# Patient Record
Sex: Male | Born: 1935 | Race: White | Hispanic: No | Marital: Married | Smoking: Never smoker
Health system: Southern US, Community
[De-identification: ages and names within clinical notes are randomized; demographics above are authoritative.]

## PROBLEM LIST (undated history)

## (undated) DIAGNOSIS — E7800 Pure hypercholesterolemia, unspecified: Secondary | ICD-10-CM

## (undated) DIAGNOSIS — C9110 Chronic lymphocytic leukemia of B-cell type not having achieved remission: Secondary | ICD-10-CM

## (undated) DIAGNOSIS — E119 Type 2 diabetes mellitus without complications: Secondary | ICD-10-CM

## (undated) DIAGNOSIS — I5189 Other ill-defined heart diseases: Secondary | ICD-10-CM

## (undated) DIAGNOSIS — G459 Transient cerebral ischemic attack, unspecified: Secondary | ICD-10-CM

## (undated) DIAGNOSIS — R252 Cramp and spasm: Secondary | ICD-10-CM

## (undated) DIAGNOSIS — I10 Essential (primary) hypertension: Secondary | ICD-10-CM

## (undated) DIAGNOSIS — K469 Unspecified abdominal hernia without obstruction or gangrene: Secondary | ICD-10-CM

## (undated) HISTORY — PX: HEMORRHOID SURGERY: SHX153

## (undated) HISTORY — PX: HERNIA REPAIR: SHX51

---

## 1999-08-22 ENCOUNTER — Other Ambulatory Visit: Admission: RE | Admit: 1999-08-22 | Discharge: 1999-08-22 | Payer: Self-pay | Admitting: *Deleted

## 2004-10-27 ENCOUNTER — Encounter: Admission: RE | Admit: 2004-10-27 | Discharge: 2005-01-25 | Payer: Self-pay | Admitting: Internal Medicine

## 2008-06-03 ENCOUNTER — Inpatient Hospital Stay (HOSPITAL_COMMUNITY): Admission: EM | Admit: 2008-06-03 | Discharge: 2008-06-05 | Payer: Self-pay | Admitting: Emergency Medicine

## 2010-02-03 ENCOUNTER — Inpatient Hospital Stay (HOSPITAL_COMMUNITY): Admission: EM | Admit: 2010-02-03 | Discharge: 2010-02-06 | Payer: Self-pay | Admitting: Emergency Medicine

## 2010-06-19 ENCOUNTER — Inpatient Hospital Stay (HOSPITAL_COMMUNITY): Admission: EM | Admit: 2010-06-19 | Discharge: 2010-06-24 | Payer: Self-pay | Admitting: Emergency Medicine

## 2011-01-23 LAB — CBC
HCT: 40.3 % (ref 39.0–52.0)
HCT: 46.1 % (ref 39.0–52.0)
Hemoglobin: 13.6 g/dL (ref 13.0–17.0)
Hemoglobin: 14.6 g/dL (ref 13.0–17.0)
MCH: 30.1 pg (ref 26.0–34.0)
MCV: 89.3 fL (ref 78.0–100.0)
Platelets: 125 10*3/uL — ABNORMAL LOW (ref 150–400)
RDW: 14.3 % (ref 11.5–15.5)
WBC: 19.7 10*3/uL — ABNORMAL HIGH (ref 4.0–10.5)
WBC: 20.9 10*3/uL — ABNORMAL HIGH (ref 4.0–10.5)

## 2011-01-23 LAB — DIFFERENTIAL
Basophils Absolute: 0 10*3/uL (ref 0.0–0.1)
Basophils Absolute: 0 10*3/uL (ref 0.0–0.1)
Basophils Relative: 0 % (ref 0–1)
Basophils Relative: 0 % (ref 0–1)
Eosinophils Absolute: 0.1 10*3/uL (ref 0.0–0.7)
Eosinophils Absolute: 0.2 10*3/uL (ref 0.0–0.7)
Eosinophils Relative: 1 % (ref 0–5)
Lymphocytes Relative: 52 % — ABNORMAL HIGH (ref 12–46)
Lymphocytes Relative: 65 % — ABNORMAL HIGH (ref 12–46)
Lymphs Abs: 10.9 10*3/uL — ABNORMAL HIGH (ref 0.7–4.0)
Lymphs Abs: 12.4 10*3/uL — ABNORMAL HIGH (ref 0.7–4.0)
Lymphs Abs: 13.2 10*3/uL — ABNORMAL HIGH (ref 0.7–4.0)
Monocytes Absolute: 1 10*3/uL (ref 0.1–1.0)
Monocytes Relative: 4 % (ref 3–12)
Monocytes Relative: 5 % (ref 3–12)
Monocytes Relative: 5 % (ref 3–12)
Neutro Abs: 6.1 10*3/uL (ref 1.7–7.7)
Neutro Abs: 9 10*3/uL — ABNORMAL HIGH (ref 1.7–7.7)
Neutrophils Relative %: 30 % — ABNORMAL LOW (ref 43–77)

## 2011-01-23 LAB — BASIC METABOLIC PANEL
BUN: 11 mg/dL (ref 6–23)
BUN: 16 mg/dL (ref 6–23)
Calcium: 9.5 mg/dL (ref 8.4–10.5)
Chloride: 107 mEq/L (ref 96–112)
Creatinine, Ser: 1.19 mg/dL (ref 0.4–1.5)
Creatinine, Ser: 1.31 mg/dL (ref 0.4–1.5)
GFR calc Af Amer: >60 mL/min (ref >60)
GFR calc non Af Amer: 52 mL/min — ABNORMAL LOW (ref >60)
GFR calc non Af Amer: 58 mL/min — ABNORMAL LOW (ref >60)
Glucose, Bld: 131 mg/dL — ABNORMAL HIGH (ref 70–99)
Potassium: 4.2 mEq/L (ref 3.5–5.1)
Sodium: 141 mEq/L (ref 135–145)

## 2011-01-23 LAB — CROSSMATCH
ABO/RH(D): B NEG
Antibody Screen: NEGATIVE

## 2011-01-23 LAB — MRSA PCR SCREENING: MRSA by PCR: NEGATIVE

## 2011-01-23 LAB — ABO/RH: ABO/RH(D): B NEG

## 2011-02-01 LAB — COMPREHENSIVE METABOLIC PANEL
ALT: 14 U/L (ref 0–53)
AST: 19 U/L (ref 0–37)
Alkaline Phosphatase: 54 U/L (ref 39–117)
Chloride: 103 mEq/L (ref 96–112)
Creatinine, Ser: 1.31 mg/dL (ref 0.4–1.5)
GFR calc Af Amer: >60 mL/min (ref >60)
Potassium: 4.1 mEq/L (ref 3.5–5.1)
Sodium: 141 mEq/L (ref 135–145)

## 2011-02-01 LAB — GLUCOSE, CAPILLARY
Glucose-Capillary: 128 mg/dL — ABNORMAL HIGH (ref 70–99)
Glucose-Capillary: 133 mg/dL — ABNORMAL HIGH (ref 70–99)
Glucose-Capillary: 135 mg/dL — ABNORMAL HIGH (ref 70–99)
Glucose-Capillary: 141 mg/dL — ABNORMAL HIGH (ref 70–99)
Glucose-Capillary: 143 mg/dL — ABNORMAL HIGH (ref 70–99)

## 2011-02-01 LAB — BASIC METABOLIC PANEL
BUN: 11 mg/dL (ref 6–23)
BUN: 15 mg/dL (ref 6–23)
BUN: 15 mg/dL (ref 6–23)
CO2: 29 mEq/L (ref 19–32)
CO2: 30 mEq/L (ref 19–32)
Chloride: 103 mEq/L (ref 96–112)
Chloride: 103 mEq/L (ref 96–112)
Creatinine, Ser: 1.31 mg/dL (ref 0.4–1.5)
Creatinine, Ser: 1.32 mg/dL (ref 0.4–1.5)
Creatinine, Ser: 1.38 mg/dL (ref 0.4–1.5)
GFR calc Af Amer: >60 mL/min (ref >60)
GFR calc non Af Amer: 52 mL/min — ABNORMAL LOW (ref >60)
Glucose, Bld: 154 mg/dL — ABNORMAL HIGH (ref 70–99)
Potassium: 3.6 mEq/L (ref 3.5–5.1)

## 2011-02-01 LAB — CBC
HCT: 40.8 % (ref 39.0–52.0)
MCHC: 34.7 g/dL (ref 30.0–36.0)
MCHC: 34.9 g/dL (ref 30.0–36.0)
MCV: 87.9 fL (ref 78.0–100.0)
MCV: 88.2 fL (ref 78.0–100.0)
Platelets: 131 10*3/uL — ABNORMAL LOW (ref 150–400)
Platelets: 138 10*3/uL — ABNORMAL LOW (ref 150–400)
Platelets: 139 10*3/uL — ABNORMAL LOW (ref 150–400)
Platelets: 165 10*3/uL (ref 150–400)
RDW: 14.1 % (ref 11.5–15.5)
WBC: 15.2 10*3/uL — ABNORMAL HIGH (ref 4.0–10.5)
WBC: 18.8 10*3/uL — ABNORMAL HIGH (ref 4.0–10.5)

## 2011-02-01 LAB — DIFFERENTIAL
Basophils Absolute: 0.1 10*3/uL (ref 0.0–0.1)
Basophils Absolute: 0.1 10*3/uL (ref 0.0–0.1)
Basophils Relative: 0 % (ref 0–1)
Basophils Relative: 1 % (ref 0–1)
Eosinophils Absolute: 0.1 10*3/uL (ref 0.0–0.7)
Eosinophils Absolute: 0.1 10*3/uL (ref 0.0–0.7)
Eosinophils Relative: 0 % (ref 0–5)
Eosinophils Relative: 1 % (ref 0–5)
Lymphocytes Relative: 62 % — ABNORMAL HIGH (ref 12–46)
Lymphs Abs: 11.7 10*3/uL — ABNORMAL HIGH (ref 0.7–4.0)
Monocytes Relative: 4 % (ref 3–12)
Monocytes Relative: 4 % (ref 3–12)
Neutro Abs: 6.9 10*3/uL (ref 1.7–7.7)
Neutrophils Relative %: 30 % — ABNORMAL LOW (ref 43–77)
Neutrophils Relative %: 31 % — ABNORMAL LOW (ref 43–77)
Neutrophils Relative %: 33 % — ABNORMAL LOW (ref 43–77)
Neutrophils Relative %: 35 % — ABNORMAL LOW (ref 43–77)

## 2011-02-01 LAB — LIPASE, BLOOD: Lipase: 36 U/L (ref 11–59)

## 2011-02-01 LAB — URINALYSIS, ROUTINE W REFLEX MICROSCOPIC
Bilirubin Urine: NEGATIVE
Hgb urine dipstick: NEGATIVE
Ketones, ur: NEGATIVE mg/dL
Nitrite: NEGATIVE
Urobilinogen, UA: 0.2 mg/dL (ref 0.0–1.0)
pH: 6.5 (ref 5.0–8.0)

## 2011-03-24 NOTE — Discharge Summary (Signed)
NAME:  Jeremy Mcdaniel, Jeremy Mcdaniel                ACCOUNT NO.:  [id number]   MEDICAL RECORD NO.:  [id number]          PATIENT TYPE:  INP   LOCATION:  A327                          FACILITY:  [facility]   PHYSICIAN:  Dorris Singh, DO    DATE OF BIRTH:  09/01/1923   DATE OF ADMISSION:  06/03/2008  DATE OF DISCHARGE:  07/28/2009LH                               DISCHARGE SUMMARY   ADMISSION DIAGNOSES:  1. Vertigo.  2. Leukocytosis.  3. History of diabetes.  4. History of hypertension.  5. History of hypercholesterolemia.   DISCHARGE DIAGNOSES:  1. Vertigo, resolving.  2. Leukocytosis, resolved.  3. History of diabetes.  4. History of hypertension.  5. History of hypercholesterolemia.   Testing that was done includes on July 27, a chest x-ray which showed  cardiomegaly and central pulmonary vessel nominate, question a tortuous  aorta, dilated esophagus versus hiatal hernia.  MRI of the brain on the  27th demonstrates chronic white matter changes at present most likely  due to chronic ischemia.  No abnormality.  On July 27, also carotid  Doppler was done which showed no significant carotid plaquing or  stenosis.  His H&P was done by Dr. Lilian Kapur.  To summarize, the patient  is an 77 year old Caucasian male who presented to the [facility]  Emergency Room stating after he was at church and while he was at  church, he found that he was unable to start walking and became very  dizzy and lightheaded and still had the sensation of the church  spinning.  He was then brought and evaluated and diagnosed with vertigo.  He was started on meclizine as well as IV fluids.  He did have a  leukocytosis of an unknown site.  It was thought to be sinus, so he was  placed on Levaquin and which he continued to improve.  Today, the  patient was seen.   Vital signs are within normal limits.  He was seen in the room with his  wife.  He states he wants to go home.  Heart rate is regular rate and rhythm.  LUNGS:  Clear to  auscultation bilaterally.  ABDOMEN:  Soft, nontender, nondistended.  The patient states that the vertigo has decreased.   His white count today is 11.7, hemoglobin 15.2, hematocrit 45.2, and  platelet count 144.  Sodium was 143, potassium 3.7, chloride 107, CO2  29, glucose 120, BUN 19, creatinine 1.30.   The patient will be sent home on the following medications:  1. Atenolol 50/25, 1 p.o. daily.  2. Aspirin 81 mg 1 p.o. daily.  3. Simvastatin 20 mg 1 p.o. daily.  4. He will also be on Antivert 25 mg p.o. q.6h. p.r.n.  5. Levaquin 500 mg p.o. daily x5 days.   He is to follow up with his primary care physician in the next 3-5 days.  He is to go home on a diabetic diet.  He is to increase his activity  slowly and to walk with assistance.  He is also to do vertigo rehab  exercises which I showed him in looking at objects  3-5 feet away,  fixating on an object, and shaking your head yes and no, doing this 20  times every hour on the hour for the next several days until he  improves.  Also, it is noted that the patient has diabetes; however,  there is no mention of any diabetic medications that he is on.  I will  clarify this with the patient prior to discharge today and have him  follow up with his primary care physician which we do not have listed,  but I assume we will get that information as well.      Dorris Singh, DO  Electronically Signed     CB/MEDQ  D:  06/05/2008  T:  06/05/2008  Job:  [id number]

## 2011-03-24 NOTE — H&P (Signed)
NAME:  Jeremy Mcdaniel, Jeremy Mcdaniel                ACCOUNT NO.:  [id number]   MEDICAL RECORD NO.:  [id number]          PATIENT TYPE:  INP   LOCATION:  A327                          FACILITY:  [facility]   PHYSICIAN:  Skeet Latch, DO    DATE OF BIRTH:  12-20-1922   DATE OF ADMISSION:  06/03/2008  DATE OF DISCHARGE:  LH                              HISTORY & PHYSICAL   CHIEF COMPLAINT:  Dizziness.   HISTORY OF PRESENT ILLNESS:  This is an 77 year old Caucasian male who  presents with dizziness.  The patient states after going to church he  was singing in the choir and after walking back to his seat he began to  be lightheaded, dizzy, and felt that the room was spinning.  The  patient's says after being seated, he still had the sensation that the  room was spinning.  The patient was then brought to the emergency room  to be evaluated.  The patient has no history of any prior episodes of  lightheadedness or dizziness.  On my examination, the patient is lying  in the bed stating the symptoms are about 75% relieved at this time.  The patient denies any chest pain, shortness of breath, or any abdominal  discomfort or headache.   PAST MEDICAL HISTORY:  1. Diabetes.  2. Hypercholesterolemia.  3. Hypertension.  4. Leukemia.   PAST SURGICAL HISTORY:  1. Hemorrhoidectomy.  2. Hernia repair.   SOCIAL HISTORY:  No history of alcohol, smoking, or drug abuse.  He does  live with his wife.   ALLERGIES:  CODEINE, SULFA, PENICILLIN, AZULFIDINE.   HOME MEDICATIONS:  1. Atenolol unknown dose once a day.  2. Aspirin 81 mg daily.  3. Cholesterol medication.   REVIEW OF SYSTEMS:  HEENT:  Slightly blurred vision.  CARDIOVASCULAR:  No chest pain or palpitations.  He did have some diaphoresis.  RESPIRATORY:  No shortness of breath or dyspnea.  GASTROINTESTINAL:  No  nausea and vomiting, diarrhea, or abdominal pain.  GENITOURINARY:  No  urgency or frequency.  MUSCULOSKELETAL:  Unremarkable.  SKIN:  Unremarkable.   NEUROLOGY:  Positive for some mild vertigo.  Other  systems unremarkable.   PHYSICAL EXAMINATION:  VITAL SIGNS:  Temperature 97.1, respirations 20,  pulse 58, blood pressure 170/74.  GENERAL:  He is well-developed, well-nourished, and well-hydrated in no  acute distress.  HEENT:  Head is normocephalic and atraumatic.  PERRLA.  EOMI.  NECK:  Soft, supple, nontender, and nondistended.  CARDIOVASCULAR:  Regular rate and rhythm.  Slight ejection murmur.  No  rubs or gallops.  LUNGS:  Clear to auscultation bilaterally.  No rales, rhonchi, or  wheezing.  ABDOMEN:  Obese, soft, nontender, and nondistended.  He does have some  abdominal fullness in his left lower quadrant.  No rigidity or guarding.  EXTREMITIES:  He has trace edema.  No clubbing or cyanosis.  NEUROLOGY:  Cranial nerves II-XII grossly intact.  The patient is alert  and oriented x3.  Good coordination and normal speech.  He has some  vertigo with change in position.   EKG showed normal sinus rhythm,  normal axis.  Normal QRS.  No ST or T  wave changes.   LABORATORY DATA:  White count 13.7, hemoglobin 16.2, hematocrit 47.6,  platelet count 153.  Sodium 140, potassium 3.7, chloride 103, CO2 29,  glucose 133, BUN 19, creatinine 1.29, calcium 9.4.  AST 20, ALT 19,  alkaline phosphatase 62, total bilirubin 0.5.  Troponin was less than  0.05.   ASSESSMENT:  1. Vertigo.  2. Leukocytosis.  3. History of diabetes.  4. History of hypertension.  5. History of hypercholesterolemia.   PLAN:  The patient will be admitted to the service of [facility].  For  his vertigo, we will rule out any acute bleed or masses.  We will get an  MRI in the a.m.  The patient will be placed on Meclizine every 4-6 hours  as needed for any vertigo type symptoms.  The patient may benefit from  some vestibular exercises also.  We will also get some orthostatics upon  admission.  For leukocytosis we will obtain blood and urine cultures.  Empirically place  the patient on Cipro orally at this time.  For his  diabetes, the patient will be placed on a sliding scale with blood  sugars checked a.c. and h.s.  For his hypertension, the patient will be  placed on atenolol, I do not know his home daily dose.  We will put him  on a low dose of atenolol at this time and await his home dose.  The  patient will be on DVT as well as GI prophylaxis.      Skeet Latch, DO  Electronically Signed     SM/MEDQ  D:  06/03/2008  T:  06/03/2008  Job:  [id number]

## 2011-03-24 NOTE — Group Therapy Note (Signed)
NAME:  BARETT, WHIDBEE                ACCOUNT NO.:  [id number]   MEDICAL RECORD NO.:  [id number]          PATIENT TYPE:  INP   LOCATION:  A327                          FACILITY:  [facility]   PHYSICIAN:  Skeet Latch, DO    DATE OF BIRTH:  [birth year]   DATE OF PROCEDURE:  06/04/2008  DATE OF DISCHARGE:                                 PROGRESS NOTE   SUBJECTIVE:  Mr. Housand still states that he has some mild vertigo-type  symptoms.  The patient states they seem to be slowly improving, but they  are still present at this time.  The patient denies any chest pain,  headache, nausea, vomiting or any abdominal complaints at this time.   OBJECTIVE:  VITAL SIGNS:  Temperature 97.8, pulse 57, respirations 20,  blood pressure 164/84.  CARDIOVASCULAR:  Regular rate and rhythm.  He has a systolic ejection  murmur heard at the left sternal border.  No rubs or gallops.  LUNGS:  Clear to auscultation bilaterally.  No rhonchi, rales or  wheezes.  ABDOMEN:  Soft, nontender, nondistended.  Positive bowel sounds.  No  rigidity or guarding.  EXTREMITIES:  Trace edema.  No clubbing or cyanosis.  Good pulses.   LABORATORY DATA:  White count 16.7, hemoglobin 15.3, hematocrit 44.4,  platelets 159.  Blood cultures are pending.   ASSESSMENT/PLAN:  1. Vertigo.  The patient's vertigo still present at this time.  Will      continue him on meclizine every 6 hours.  The patient is go get an      MRI of his brain this a.m. as well as carotid Doppler studies.      Will give further recommendations after the results of these      studies.  2. Leukocytosis.  Seems to be slightly worsened this a.m.  The patient      was started on Cipro orally.  Will change him to IV antibiotics at      this time and awaiting urine and embolic blood cultures.  The      patient does have a pending chest x-ray this a.m.      Skeet Latch, DO  Electronically Signed     SM/MEDQ  D:  06/04/2008  T:  06/04/2008  Job:  [id number]

## 2011-08-07 LAB — COMPREHENSIVE METABOLIC PANEL
AST: 20
Albumin: 4
BUN: 19
Calcium: 9.4
Creatinine, Ser: 1.29
GFR calc Af Amer: >60
GFR calc non Af Amer: 53 — ABNORMAL LOW

## 2011-08-07 LAB — BASIC METABOLIC PANEL
CO2: 27
CO2: 29
Calcium: 9.3
Chloride: 104
Chloride: 107
Creatinine, Ser: 1.3
GFR calc Af Amer: >60
GFR calc Af Amer: >60
Glucose, Bld: 117 — ABNORMAL HIGH
Glucose, Bld: 120 — ABNORMAL HIGH
Potassium: 3.6
Sodium: 140
Sodium: 143

## 2011-08-07 LAB — DIFFERENTIAL
Basophils Absolute: 0
Basophils Absolute: 0.1
Basophils Absolute: 0.1
Basophils Relative: 0
Basophils Relative: 0
Eosinophils Absolute: 0.1
Eosinophils Relative: 1
Eosinophils Relative: 1
Eosinophils Relative: 1
Lymphocytes Relative: 65 — ABNORMAL HIGH
Lymphs Abs: 9 — ABNORMAL HIGH
Monocytes Absolute: 0.6
Monocytes Absolute: 0.8
Monocytes Relative: 5
Monocytes Relative: 5
Monocytes Relative: 6
Neutro Abs: 3.7
Neutro Abs: 3.8

## 2011-08-07 LAB — CBC
HCT: 44.4
HCT: 47.6
Hemoglobin: 15.2
Hemoglobin: 15.3
MCHC: 33.7
MCHC: 34.1
MCHC: 34.5
MCV: 89.9
MCV: 90
Platelets: 153
RBC: 5
RBC: 5.02
RDW: 13.5
RDW: 13.9

## 2011-08-07 LAB — POCT CARDIAC MARKERS
CKMB, poc: 2.2
Operator id: 208461
Troponin i, poc: <0.05

## 2011-08-07 LAB — URINALYSIS, ROUTINE W REFLEX MICROSCOPIC
Bilirubin Urine: NEGATIVE
Ketones, ur: NEGATIVE
Nitrite: NEGATIVE
Protein, ur: NEGATIVE
Urobilinogen, UA: 0.2
pH: 7

## 2011-08-07 LAB — CULTURE, BLOOD (ROUTINE X 2)
Culture: NO GROWTH
Report Status: 8022009

## 2011-08-07 LAB — URINE CULTURE: Colony Count: NO GROWTH

## 2011-09-27 ENCOUNTER — Emergency Department (HOSPITAL_COMMUNITY): Payer: Medicare Other

## 2011-09-27 ENCOUNTER — Emergency Department (HOSPITAL_COMMUNITY)
Admission: EM | Admit: 2011-09-27 | Discharge: 2011-09-27 | Disposition: A | Discharge status: Home / Self Care | Payer: Medicare Other | Attending: Emergency Medicine | Admitting: Emergency Medicine

## 2011-09-27 DIAGNOSIS — M79609 Pain in unspecified limb: Secondary | ICD-10-CM | POA: Insufficient documentation

## 2011-09-27 DIAGNOSIS — M25559 Pain in unspecified hip: Secondary | ICD-10-CM | POA: Insufficient documentation

## 2011-09-27 DIAGNOSIS — X500XXA Overexertion from strenuous movement or load, initial encounter: Secondary | ICD-10-CM | POA: Insufficient documentation

## 2011-09-27 DIAGNOSIS — I10 Essential (primary) hypertension: Secondary | ICD-10-CM | POA: Insufficient documentation

## 2011-09-27 DIAGNOSIS — S76019A Strain of muscle, fascia and tendon of unspecified hip, initial encounter: Secondary | ICD-10-CM

## 2011-09-27 DIAGNOSIS — Z7982 Long term (current) use of aspirin: Secondary | ICD-10-CM | POA: Insufficient documentation

## 2011-09-27 DIAGNOSIS — E7800 Pure hypercholesterolemia, unspecified: Secondary | ICD-10-CM | POA: Insufficient documentation

## 2011-09-27 DIAGNOSIS — IMO0002 Reserved for concepts with insufficient information to code with codable children: Secondary | ICD-10-CM | POA: Insufficient documentation

## 2011-09-27 HISTORY — DX: Essential (primary) hypertension: I10

## 2011-09-27 HISTORY — DX: Pure hypercholesterolemia, unspecified: E78.00

## 2011-09-27 HISTORY — DX: Unspecified abdominal hernia without obstruction or gangrene: K46.9

## 2011-09-27 MED ORDER — IBUPROFEN 800 MG PO TABS
800.0000 mg | ORAL_TABLET | Freq: Once | ORAL | Status: AC
Start: 2011-09-27 — End: 2011-09-27
  Administered 2011-09-27: 800 mg via ORAL
  Filled 2011-09-27: qty 1

## 2011-09-27 MED ORDER — TRAMADOL HCL 50 MG PO TABS: 50.0000 mg | ORAL_TABLET | Freq: Four times a day (QID) | ORAL | Status: DC | PRN

## 2011-09-27 MED ORDER — TRAMADOL HCL 50 MG PO TABS: 50.0000 mg | ORAL_TABLET | Freq: Four times a day (QID) | ORAL | Status: AC | PRN

## 2011-09-27 NOTE — ED Notes (Signed)
MD at bedside. Dr Manus Gunning at bedside, discussing plan of care with pt and family.

## 2011-09-27 NOTE — ED Provider Notes (Signed)
History     CSN: [id number] Arrival date & time: 09/27/2011 12:46 PM   First MD Initiated Contact with Patient 09/27/11 1300      Chief Complaint  Patient presents with  . Hip Pain  . Leg Pain    (Consider location/radiation/quality/duration/timing/severity/associated sxs/prior treatment) HPI Comments: 77 year old male presenting with right buttock and leg pain started yesterday. He states he was climbing a ladder yesterday putting trees and the pain started shortly after that. He denies any falls. He is in pain with walking but still able to ambulate. The pain is in his right buttock and radiates down the side and back of his right side. He stopped at about his knee.  He's had no previous back related problems. He denies any fever, vomiting, bowel or bladder incontinence, , weakness, numbness or tingling. He denies pain in a pins and needle sensation. Denies abdominal pain, chest pain, groin pain, back pain.  Patient is a 77 y.o. male presenting with hip pain. The history is provided by the patient and a relative.  Hip Pain This is a new problem. The current episode started yesterday. The problem occurs constantly. The problem has been gradually improving. Pertinent negatives include no chest pain, no abdominal pain, no headaches and no shortness of breath. The symptoms are aggravated by walking, bending and twisting. The symptoms are relieved by acetaminophen, heat and NSAIDs. He has tried a warm compress for the symptoms. The treatment provided mild relief.    Past Medical History  Diagnosis Date  . Hypertension   . Hypercholesterolemia   . Hernia     Past Surgical History  Procedure Date  . Hernia repair     No family history on file.  History  Substance Use Topics  . Smoking status: Never Smoker   . Smokeless tobacco: Not on file  . Alcohol Use: No      Review of Systems  Constitutional: Positive for activity change.  HENT: Negative for congestion and rhinorrhea.    Respiratory: Negative for cough and shortness of breath.   Cardiovascular: Negative for chest pain.  Gastrointestinal: Negative for nausea, vomiting and abdominal pain.  Genitourinary: Negative for dysuria, difficulty urinating and testicular pain.  Musculoskeletal: Negative for back pain.  Neurological: Negative for weakness, numbness and headaches.    Allergies  Azulfidine; Codeine; Penicillins; and Sulfa antibiotics  Home Medications   Current Outpatient Rx  Name Route Sig Dispense Refill  . ASPIRIN EC 81 MG PO TBEC Oral Take 81 mg by mouth daily.     . ATENOLOL PO Oral Take 1 tablet by mouth daily. Unknown strength. Does not take when BP is "too low"    . LISINOPRIL PO Oral Take 0.5 tablets by mouth daily. Unknown strength     . SIMVASTATIN PO Oral Take 0.5 tablets by mouth daily. Unknown strength    . TRAMADOL HCL 50 MG PO TABS Oral Take 1 tablet (50 mg total) by mouth every 6 (six) hours as needed for pain. Maximum dose= 8 tablets per day 15 tablet 0    BP 152/69  Pulse 55  Temp(Src) 98.7 F (37.1 C) (Oral)  Resp 20  Ht 5\' 7"  (1.702 m)  Wt 198 lb (89.812 kg)  BMI 31.01 kg/m2  SpO2 100%  Physical Exam  Constitutional: He is oriented to person, place, and time. He appears well-developed and well-nourished. No distress.  HENT:  Head: Normocephalic and atraumatic.  Mouth/Throat: Oropharynx is clear and moist. No oropharyngeal exudate.  Eyes: Conjunctivae are  normal. Pupils are equal, round, and reactive to light.  Neck: Normal range of motion.  Cardiovascular: Normal rate, regular rhythm and normal heart sounds.   Pulmonary/Chest: Effort normal and breath sounds normal. No respiratory distress.  Abdominal: Soft. There is no tenderness. There is no rebound and no guarding.       No pulsatile masses  Musculoskeletal: Normal range of motion. He exhibits no edema and no tenderness.       No lumbar spine tenderness, no step off or deformity. Full range of motion of right  hip and knee without pain. Straight leg raise test negative bilaterally  Neurological: He is alert and oriented to person, place, and time. No cranial nerve deficit.       5/5 strength in bilateral lower extremities. Able to dorsiflex and plantarflex ankles and extend great toes. +2  DP PT pulses bilaterally there is no calf tenderness or palpable cords. Unable to illicit patellar reflexes bilaterally  Skin: Skin is warm.       No rashes    ED Course  Procedures (including critical care time)  Labs Reviewed - No data to display Dg Lumbar Spine Complete  09/27/2011  *RADIOLOGY REPORT*  Clinical Data: Right hip/leg pain  LUMBAR SPINE - COMPLETE 4+ VIEW  Comparison: CT abdomen pelvis dated 06/18/2010  Findings: There are five lumbar-type vertebral bodies.  No evidence of fracture or dislocation.  Vertebral body heights are maintained.  Moderate multilevel degenerative changes.  Prior left hernia repair.  IMPRESSION: No fracture or dislocation is seen.  Moderate multilevel degenerative changes.  Original Report Authenticated By: Charline Bills, M.D.   Dg Pelvis 1-2 Views  09/27/2011  *RADIOLOGY REPORT*  Clinical Data: Right hip pain  PELVIS - 1-2 VIEW  Comparison: CT abdomen pelvis dated 06/18/2010  Findings: No fracture or dislocation is seen.  Bilateral hip joint spaces are symmetric.  Left ventral hernia repair.  IMPRESSION: No fracture or dislocation is seen.  Original Report Authenticated By: Charline Bills, M.D.     1. Hip strain       MDM  R buttock and thigh pain after exertion and in sciatic distribution.  No neuro or pulse deficit.  No red flags. No back, chest, or abdominal pain.  Source of pain appears to be musculoskeletal, but other diagnoses considered (AAA, CNS pathology, GB) and unlikely.  Able to ambulate with normal gait.   Given age, will obtain plain film imaging.   imaging is negative for any acute pathology. Patient states he is in no pain at this time. He is  ambulatory in the hallways without assistance.       Glynn Octave, MD 09/27/11 2252

## 2011-09-27 NOTE — ED Notes (Signed)
Pt presents with right hip and leg pain. Pt was climbing a ladder yesterday and after finished with working pt started having pain. Pt unable to sleep.

## 2012-05-04 NOTE — Patient Instructions (Addendum)
Your procedure is scheduled on: 05/16/2012  Report to [facility] at  900       AM.  Call this number if you have problems the morning of surgery: [phone]   Do not eat food or drink liquids :After Midnight.      Take these medicines the morning of surgery with A SIP OF WATER: atenolol, lisinopril   Do not wear jewelry, make-up or nail polish.  Do not wear lotions, powders, or perfumes. You may wear deodorant.  Do not shave 48 hours prior to surgery.  Do not bring valuables to the hospital.  Contacts, dentures or bridgework may not be worn into surgery.  Leave suitcase in the car. After surgery it may be brought to your room.  For patients admitted to the hospital, checkout time is 11:00 AM the day of discharge.   Patients discharged the day of surgery will not be allowed to drive home.  :     Please read over the following fact sheets that you were given: Coughing and Deep Breathing, Surgical Site Infection Prevention, Anesthesia Post-op Instructions and Care and Recovery After Surgery    Cataract A cataract is a clouding of the lens of the eye. When a lens becomes cloudy, vision is reduced based on the degree and nature of the clouding. Many cataracts reduce vision to some degree. Some cataracts make people more near-sighted as they develop. Other cataracts increase glare. Cataracts that are ignored and become worse can sometimes look white. The white color can be seen through the pupil. CAUSES   Aging. However, cataracts may occur at any age, even in newborns.   Certain drugs.   Trauma to the eye.   Certain diseases such as diabetes.   Specific eye diseases such as chronic inflammation inside the eye or a sudden attack of a rare form of glaucoma.   Inherited or acquired medical problems.  SYMPTOMS   Gradual, progressive drop in vision in the affected eye.   Severe, rapid visual loss. This most often happens when trauma is the cause.  DIAGNOSIS  To detect a cataract, an  eye doctor examines the lens. Cataracts are best diagnosed with an exam of the eyes with the pupils enlarged (dilated) by drops.  TREATMENT  For an early cataract, vision may improve by using different eyeglasses or stronger lighting. If that does not help your vision, surgery is the only effective treatment. A cataract needs to be surgically removed when vision loss interferes with your everyday activities, such as driving, reading, or watching TV. A cataract may also have to be removed if it prevents examination or treatment of another eye problem. Surgery removes the cloudy lens and usually replaces it with a substitute lens (intraocular lens, IOL).  At a time when both you and your doctor agree, the cataract will be surgically removed. If you have cataracts in both eyes, only one is usually removed at a time. This allows the operated eye to heal and be out of danger from any possible problems after surgery (such as infection or poor wound healing). In rare cases, a cataract may be doing damage to your eye. In these cases, your caregiver may advise surgical removal right away. The vast majority of people who have cataract surgery have better vision afterward. HOME CARE INSTRUCTIONS  If you are not planning surgery, you may be asked to do the following:  Use different eyeglasses.   Use stronger or brighter lighting.   Ask your eye doctor  about reducing your medicine dose or changing medicines if it is thought that a medicine caused your cataract. Changing medicines does not make the cataract go away on its own.   Become familiar with your surroundings. Poor vision can lead to injury. Avoid bumping into things on the affected side. You are at a higher risk for tripping or falling.   Exercise extreme care when driving or operating machinery.   Wear sunglasses if you are sensitive to bright light or experiencing problems with glare.  SEEK IMMEDIATE MEDICAL CARE IF:   You have a worsening or sudden  vision loss.   You notice redness, swelling, or increasing pain in the eye.   You have a fever.  Document Released: 10/26/2005 Document Revised: 10/15/2011 Document Reviewed: 06/19/2011 [facility] Patient Information 2012 [place], [place].PATIENT INSTRUCTIONS POST-ANESTHESIA  IMMEDIATELY FOLLOWING SURGERY:  Do not drive or operate machinery for the first twenty four hours after surgery.  Do not make any important decisions for twenty four hours after surgery or while taking narcotic pain medications or sedatives.  If you develop intractable nausea and vomiting or a severe headache please notify your doctor immediately.  FOLLOW-UP:  Please make an appointment with your surgeon as instructed. You do not need to follow up with anesthesia unless specifically instructed to do so.  WOUND CARE INSTRUCTIONS (if applicable):  Keep a dry clean dressing on the anesthesia/puncture wound site if there is drainage.  Once the wound has quit draining you may leave it open to air.  Generally you should leave the bandage intact for twenty four hours unless there is drainage.  If the epidural site drains for more than 36-48 hours please call the anesthesia department.  QUESTIONS?:  Please feel free to call your physician or the hospital operator if you have any questions, and they will be happy to assist you.

## 2012-05-05 ENCOUNTER — Other Ambulatory Visit: Payer: Self-pay

## 2012-05-05 ENCOUNTER — Encounter (HOSPITAL_COMMUNITY)
Admission: RE | Admit: 2012-05-05 | Discharge: 2012-05-05 | Disposition: A | Discharge status: Home / Self Care | Payer: Medicare Other | Source: Ambulatory Visit | Attending: Ophthalmology | Admitting: Ophthalmology

## 2012-05-05 ENCOUNTER — Encounter (HOSPITAL_COMMUNITY): Payer: Self-pay

## 2012-05-05 HISTORY — DX: Cramp and spasm: R25.2

## 2012-05-05 LAB — BASIC METABOLIC PANEL
BUN: 29 mg/dL — ABNORMAL HIGH (ref 6–23)
Calcium: 9.8 mg/dL (ref 8.4–10.5)
Creatinine, Ser: 1.64 mg/dL — ABNORMAL HIGH (ref 0.50–1.35)
GFR calc Af Amer: 42 mL/min — ABNORMAL LOW (ref >90)
GFR calc non Af Amer: 36 mL/min — ABNORMAL LOW (ref >90)
Glucose, Bld: 118 mg/dL — ABNORMAL HIGH (ref 70–99)

## 2012-05-13 MED ORDER — NEOMYCIN-POLYMYXIN-DEXAMETH 3.5-10000-0.1 OP OINT
TOPICAL_OINTMENT | OPHTHALMIC | Status: AC
  Filled 2012-05-13: qty 3.5

## 2012-05-13 MED ORDER — LIDOCAINE HCL (PF) 1 % IJ SOLN
INTRAMUSCULAR | Status: AC
  Filled 2012-05-13: qty 2

## 2012-05-13 MED ORDER — PHENYLEPHRINE HCL 2.5 % OP SOLN
OPHTHALMIC | Status: AC
  Filled 2012-05-13: qty 2

## 2012-05-13 MED ORDER — TETRACAINE HCL 0.5 % OP SOLN
OPHTHALMIC | Status: AC
  Filled 2012-05-13: qty 2

## 2012-05-13 MED ORDER — LIDOCAINE HCL 3.5 % OP GEL
OPHTHALMIC | Status: AC
  Filled 2012-05-13: qty 5

## 2012-05-13 MED ORDER — CYCLOPENTOLATE-PHENYLEPHRINE 0.2-1 % OP SOLN
OPHTHALMIC | Status: AC
  Filled 2012-05-13: qty 2

## 2012-05-16 ENCOUNTER — Encounter (HOSPITAL_COMMUNITY): Payer: Self-pay | Admitting: Anesthesiology

## 2012-05-16 ENCOUNTER — Encounter (HOSPITAL_COMMUNITY)
Admission: RE | Disposition: A | Discharge status: Home Health | Payer: Self-pay | Source: Ambulatory Visit | Attending: Ophthalmology

## 2012-05-16 ENCOUNTER — Ambulatory Visit (HOSPITAL_COMMUNITY)
Admission: RE | Admit: 2012-05-16 | Discharge: 2012-05-16 | Disposition: A | Discharge status: Home Health | Payer: Medicare Other | Source: Ambulatory Visit | Attending: Ophthalmology | Admitting: Ophthalmology

## 2012-05-16 ENCOUNTER — Ambulatory Visit (HOSPITAL_COMMUNITY): Payer: Medicare Other | Admitting: Anesthesiology

## 2012-05-16 ENCOUNTER — Encounter (HOSPITAL_COMMUNITY): Payer: Self-pay | Admitting: Ophthalmology

## 2012-05-16 DIAGNOSIS — Z01812 Encounter for preprocedural laboratory examination: Secondary | ICD-10-CM | POA: Insufficient documentation

## 2012-05-16 DIAGNOSIS — Z79899 Other long term (current) drug therapy: Secondary | ICD-10-CM | POA: Insufficient documentation

## 2012-05-16 DIAGNOSIS — E119 Type 2 diabetes mellitus without complications: Secondary | ICD-10-CM | POA: Insufficient documentation

## 2012-05-16 DIAGNOSIS — H2510 Age-related nuclear cataract, unspecified eye: Secondary | ICD-10-CM | POA: Insufficient documentation

## 2012-05-16 DIAGNOSIS — Z01810 Encounter for preprocedural cardiovascular examination: Secondary | ICD-10-CM | POA: Insufficient documentation

## 2012-05-16 DIAGNOSIS — I10 Essential (primary) hypertension: Secondary | ICD-10-CM | POA: Insufficient documentation

## 2012-05-16 HISTORY — PX: CATARACT EXTRACTION W/PHACO: SHX586

## 2012-05-16 SURGERY — PHACOEMULSIFICATION, CATARACT, WITH IOL INSERTION
Anesthesia: Monitor Anesthesia Care | Site: Eye | Laterality: Left | Wound class: Clean

## 2012-05-16 MED ORDER — POVIDONE-IODINE 5 % OP SOLN
OPHTHALMIC | Status: DC | PRN
  Administered 2012-05-16: 1 via OPHTHALMIC

## 2012-05-16 MED ORDER — PROVISC 10 MG/ML IO SOLN
INTRAOCULAR | Status: DC | PRN
  Administered 2012-05-16: 8.5 mg via INTRAOCULAR

## 2012-05-16 MED ORDER — NEOMYCIN-POLYMYXIN-DEXAMETH 0.1 % OP OINT
TOPICAL_OINTMENT | OPHTHALMIC | Status: DC | PRN
  Administered 2012-05-16: 1 via OPHTHALMIC

## 2012-05-16 MED ORDER — TETRACAINE HCL 0.5 % OP SOLN
1.0000 [drp] | OPHTHALMIC | Status: AC
  Administered 2012-05-16 (×3): 1 [drp] via OPHTHALMIC

## 2012-05-16 MED ORDER — MIDAZOLAM HCL 2 MG/2ML IJ SOLN
1.0000 mg | INTRAMUSCULAR | Status: DC | PRN
  Administered 2012-05-16: 2 mg via INTRAVENOUS

## 2012-05-16 MED ORDER — PHENYLEPHRINE HCL 2.5 % OP SOLN
1.0000 [drp] | OPHTHALMIC | Status: AC
  Administered 2012-05-16 (×3): 1 [drp] via OPHTHALMIC

## 2012-05-16 MED ORDER — CYCLOPENTOLATE-PHENYLEPHRINE 0.2-1 % OP SOLN
1.0000 [drp] | OPHTHALMIC | Status: AC
  Administered 2012-05-16 (×3): 1 [drp] via OPHTHALMIC

## 2012-05-16 MED ORDER — BSS IO SOLN
INTRAOCULAR | Status: DC | PRN
  Administered 2012-05-16: 15 mL via INTRAOCULAR

## 2012-05-16 MED ORDER — MIDAZOLAM HCL 2 MG/2ML IJ SOLN
INTRAMUSCULAR | Status: AC
  Administered 2012-05-16: 2 mg via INTRAVENOUS
  Filled 2012-05-16: qty 2

## 2012-05-16 MED ORDER — EPINEPHRINE HCL 1 MG/ML IJ SOLN
INTRAOCULAR | Status: DC | PRN
  Administered 2012-05-16: 11:00:00

## 2012-05-16 MED ORDER — LIDOCAINE HCL (PF) 1 % IJ SOLN
INTRAMUSCULAR | Status: DC | PRN
  Administered 2012-05-16: .3 mL

## 2012-05-16 MED ORDER — LACTATED RINGERS IV SOLN
INTRAVENOUS | Status: DC | PRN
  Administered 2012-05-16: 10:00:00 via INTRAVENOUS

## 2012-05-16 MED ORDER — EPINEPHRINE HCL 1 MG/ML IJ SOLN
INTRAMUSCULAR | Status: AC
  Filled 2012-05-16: qty 1

## 2012-05-16 MED ORDER — GLYCOPYRROLATE 0.2 MG/ML IJ SOLN
INTRAMUSCULAR | Status: AC
  Administered 2012-05-16: 0.2 mg via INTRAVENOUS
  Filled 2012-05-16: qty 1

## 2012-05-16 MED ORDER — GLYCOPYRROLATE 0.2 MG/ML IJ SOLN
0.2000 mg | Freq: Once | INTRAMUSCULAR | Status: AC
  Administered 2012-05-16: 0.2 mg via INTRAVENOUS

## 2012-05-16 MED ORDER — LACTATED RINGERS IV SOLN
INTRAVENOUS | Status: DC
  Administered 2012-05-16: 11:00:00 via INTRAVENOUS

## 2012-05-16 MED ORDER — LIDOCAINE HCL 3.5 % OP GEL
1.0000 "application " | Freq: Once | OPHTHALMIC | Status: AC
  Administered 2012-05-16: 1 via OPHTHALMIC

## 2012-05-16 MED ORDER — LIDOCAINE 3.5 % OP GEL OPTIME - NO CHARGE
OPHTHALMIC | Status: DC | PRN
  Administered 2012-05-16: 1 [drp] via OPHTHALMIC

## 2012-05-16 SURGICAL SUPPLY — 32 items
CAPSULAR TENSION RING-AMO (OPHTHALMIC RELATED) IMPLANT
CLOTH BEACON ORANGE TIMEOUT ST (SAFETY) ×2 IMPLANT
EYE SHIELD UNIVERSAL CLEAR (GAUZE/BANDAGES/DRESSINGS) ×4 IMPLANT
GLOVE BIO SURGEON STRL SZ 6.5 (GLOVE) IMPLANT
GLOVE BIOGEL PI IND STRL 6.5 (GLOVE) ×1 IMPLANT
GLOVE BIOGEL PI IND STRL 7.0 (GLOVE) IMPLANT
GLOVE BIOGEL PI IND STRL 7.5 (GLOVE) IMPLANT
GLOVE BIOGEL PI INDICATOR 6.5 (GLOVE) ×1
GLOVE BIOGEL PI INDICATOR 7.0 (GLOVE)
GLOVE BIOGEL PI INDICATOR 7.5 (GLOVE)
GLOVE ECLIPSE 6.5 STRL STRAW (GLOVE) IMPLANT
GLOVE ECLIPSE 7.0 STRL STRAW (GLOVE) IMPLANT
GLOVE ECLIPSE 7.5 STRL STRAW (GLOVE) IMPLANT
GLOVE EXAM NITRILE LRG STRL (GLOVE) IMPLANT
GLOVE EXAM NITRILE MD LF STRL (GLOVE) ×2 IMPLANT
GLOVE SKINSENSE NS SZ6.5 (GLOVE)
GLOVE SKINSENSE NS SZ7.0 (GLOVE)
GLOVE SKINSENSE STRL SZ6.5 (GLOVE) IMPLANT
GLOVE SKINSENSE STRL SZ7.0 (GLOVE) IMPLANT
KIT VITRECTOMY (OPHTHALMIC RELATED) IMPLANT
PAD ARMBOARD 7.5X6 YLW CONV (MISCELLANEOUS) ×2 IMPLANT
PROC W NO LENS (INTRAOCULAR LENS)
PROC W SPEC LENS (INTRAOCULAR LENS)
PROCESS W NO LENS (INTRAOCULAR LENS) IMPLANT
PROCESS W SPEC LENS (INTRAOCULAR LENS) IMPLANT
RING MALYGIN (MISCELLANEOUS) IMPLANT
SIGHTPATH CAT PROC W REG LENS (Ophthalmic Related) ×2 IMPLANT
SYR TB 1ML LL NO SAFETY (SYRINGE) ×2 IMPLANT
TAPE SURG TRANSPORE 1 IN (GAUZE/BANDAGES/DRESSINGS) ×1 IMPLANT
TAPE SURGICAL TRANSPORE 1 IN (GAUZE/BANDAGES/DRESSINGS) ×1
VISCOELASTIC ADDITIONAL (OPHTHALMIC RELATED) IMPLANT
WATER STERILE IRR 250ML POUR (IV SOLUTION) ×2 IMPLANT

## 2012-05-16 NOTE — Transfer of Care (Signed)
Immediate Anesthesia Transfer of Care Note  Patient: Jeremy Mcdaniel  Procedure(s) Performed: Procedure(s) (LRB): CATARACT EXTRACTION PHACO AND INTRAOCULAR LENS PLACEMENT (IOC) (Left)  Patient Location: PACU  Anesthesia Type: MAC  Level of Consciousness: awake, alert , oriented and patient cooperative  Airway & Oxygen Therapy: Patient Spontanous Breathing  Post-op Assessment: Report given to PACU RN and Post -op Vital signs reviewed and stable  Post vital signs: Reviewed and stable  Complications: No apparent anesthesia complications

## 2012-05-16 NOTE — Anesthesia Postprocedure Evaluation (Signed)
  Anesthesia Post-op Note  Patient: Jeremy Mcdaniel  Procedure(s) Performed: Procedure(s) (LRB): CATARACT EXTRACTION PHACO AND INTRAOCULAR LENS PLACEMENT (IOC) (Left)  Patient Location: PACU  Anesthesia Type: MAC  Level of Consciousness: awake, alert  and oriented  Airway and Oxygen Therapy: Patient Spontanous Breathing  Post-op Pain: none  Post-op Assessment: Post-op Vital signs reviewed, Patient's Cardiovascular Status Stable, Respiratory Function Stable, Patent Airway and No signs of Nausea or vomiting  Post-op Vital Signs: Reviewed and stable  Complications: No apparent anesthesia complications

## 2012-05-16 NOTE — Anesthesia Procedure Notes (Signed)
Procedure Name: MAC Date/Time: 05/16/2012 11:08 AM Performed by: Carolyne Littles, AMY L Pre-anesthesia Checklist: Patient identified, Patient being monitored, Emergency Drugs available, Timeout performed and Suction available Patient Re-evaluated:Patient Re-evaluated prior to inductionOxygen Delivery Method: Nasal cannula

## 2012-05-16 NOTE — Brief Op Note (Signed)
Pre-Op Dx: Cataract OS Post-Op Dx: Cataract OS Surgeon: Karishma Unrein Anesthesia: Topical with MAC Surgery: Cataract Extraction with Intraocular lens Implant OS Implant: Lenstec, Model Softec HD Specimen: None Complications: None 

## 2012-05-16 NOTE — H&P (Signed)
I have reviewed the H&P, the patient was re-examined, and I have identified no interval changes in medical condition and plan of care since the history and physical of record  

## 2012-05-16 NOTE — Preoperative (Signed)
Beta Blockers   Reason not to administer Beta Blockers:Not Applicable 

## 2012-05-16 NOTE — Anesthesia Preprocedure Evaluation (Signed)
Anesthesia Evaluation  Patient identified by MRN, date of birth, ID band Patient awake    Reviewed: Allergy & Precautions, H&P , NPO status , Patient's Chart, lab work & pertinent test results, reviewed documented beta blocker date and time   Airway Mallampati: I TM Distance: >3 FB Neck ROM: Full    Dental  (+) Edentulous Upper and Edentulous Lower   Pulmonary neg pulmonary ROS,    Pulmonary exam normal       Cardiovascular hypertension, Pt. on medications and Pt. on home beta blockers Rhythm:Regular Rate:Normal     Neuro/Psych negative neurological ROS  negative psych ROS   GI/Hepatic   Endo/Other  Well Controlled, Type 2  Renal/GU      Musculoskeletal   Abdominal Normal abdominal exam  (+)   Peds  Hematology   Anesthesia Other Findings   Reproductive/Obstetrics                           Anesthesia Physical Anesthesia Plan  ASA: III  Anesthesia Plan: MAC   Post-op Pain Management:    Induction: Intravenous  Airway Management Planned: Nasal Cannula  Additional Equipment:   Intra-op Plan:   Post-operative Plan:   Informed Consent: I have reviewed the patients History and Physical, chart, labs and discussed the procedure including the risks, benefits and alternatives for the proposed anesthesia with the patient or authorized representative who has indicated his/her understanding and acceptance.     Plan Discussed with: CRNA  Anesthesia Plan Comments:         Anesthesia Quick Evaluation

## 2012-05-17 NOTE — Op Note (Signed)
NAME:  Jeremy Mcdaniel, Jeremy Mcdaniel                ACCOUNT NO.:  [id number]  MEDICAL RECORD NO.:  [id number]  LOCATION:  [facility]                          FACILITY:  [facility]  PHYSICIAN:  Susanne Greenhouse, MD       DATE OF BIRTH:  12/30/1922  DATE OF PROCEDURE:  05/16/2012 DATE OF DISCHARGE:  05/16/2012                              OPERATIVE REPORT   PREOPERATIVE DIAGNOSIS:  Nuclear cataract, left eye, diagnosis code 366.16.  POSTOPERATIVE DIAGNOSIS:  Nuclear cataract, left eye, diagnosis code 366.16.  OPERATION PERFORMED:  Phacoemulsification with posterior chamber intraocular lens implantation, left eye.  SURGEON:  Susanne Greenhouse, MD  ANESTHESIA:  Topical with monitored anesthesia care and IV sedation.  OPERATIVE SUMMARY:  In the preoperative area, dilating drops were placed into the left eye.  The patient was then brought into the operating room where she was placed under general anesthesia.  The eye was then prepped and draped.  Beginning with a #75 blade, a paracentesis port was made at the surgeon's 2 o'clock position.  The anterior chamber was then filled with a 1% nonpreserved lidocaine solution with epinephrine.  This was followed by Viscoat to deepen the chamber.  A small fornix-based peritomy was performed superiorly.  Next, a single iris hook was placed through the limbus superiorly.  A 2.4-mm keratome blade was then used to make a clear corneal incision over the iris hook.  A bent cystotome needle and Utrata forceps were used to create a continuous tear capsulotomy.  Hydrodissection was performed using balanced salt solution on a fine cannula.  The lens nucleus was then removed using phacoemulsification in a quadrant cracking technique.  The cortical material was then removed with irrigation and aspiration.  The capsular bag and anterior chamber were refilled with Provisc.  The wound was widened to approximately 3 mm and a posterior chamber intraocular lens was placed into the capsular bag  without difficulty using an [employer] lens injecting system.  A single 10-0 nylon suture was then used to close the incision as well as stromal hydration.  The Provisc was removed from the anterior chamber and capsular bag with irrigation and aspiration.  At this point, the wounds were tested for leak, which were negative.  The anterior chamber remained deep and stable.  The patient tolerated the procedure well.  There were no operative complications, and she awoke from general anesthesia without problem.  No surgical specimens.  Prosthetic device used is a Lenstec posterior chamber lens, model Softec HD, power of 20.25, serial number is [id number].          ______________________________ Susanne Greenhouse, MD     KEH/MEDQ  D:  05/16/2012  T:  05/17/2012  Job:  [id number]

## 2012-05-18 ENCOUNTER — Encounter (HOSPITAL_COMMUNITY): Payer: Self-pay | Admitting: Ophthalmology

## 2012-06-30 ENCOUNTER — Encounter (HOSPITAL_COMMUNITY): Payer: Self-pay

## 2012-07-07 ENCOUNTER — Encounter (HOSPITAL_COMMUNITY): Admission: RE | Admit: 2012-07-07 | Discharge: 2012-07-07 | Payer: Medicare Other | Source: Ambulatory Visit

## 2012-07-07 ENCOUNTER — Encounter (HOSPITAL_COMMUNITY): Payer: Self-pay

## 2012-07-12 MED ORDER — FENTANYL CITRATE 0.05 MG/ML IJ SOLN: 25.0000 ug | INTRAMUSCULAR | Status: DC | PRN

## 2012-07-12 MED ORDER — ACETAMINOPHEN 325 MG PO TABS: 325.0000 mg | ORAL_TABLET | ORAL | Status: DC | PRN

## 2012-07-12 MED ORDER — ONDANSETRON HCL 4 MG/2ML IJ SOLN: 4.0000 mg | Freq: Once | INTRAMUSCULAR | Status: DC | PRN

## 2012-07-13 MED ORDER — NEOMYCIN-POLYMYXIN-DEXAMETH 3.5-10000-0.1 OP OINT
TOPICAL_OINTMENT | OPHTHALMIC | Status: AC
  Filled 2012-07-13: qty 3.5

## 2012-07-13 MED ORDER — LIDOCAINE HCL 3.5 % OP GEL
OPHTHALMIC | Status: AC
  Filled 2012-07-13: qty 5

## 2012-07-13 MED ORDER — CYCLOPENTOLATE-PHENYLEPHRINE 0.2-1 % OP SOLN
OPHTHALMIC | Status: AC
  Filled 2012-07-13: qty 2

## 2012-07-13 MED ORDER — LIDOCAINE HCL (PF) 1 % IJ SOLN
INTRAMUSCULAR | Status: AC
  Filled 2012-07-13: qty 2

## 2012-07-13 MED ORDER — PHENYLEPHRINE HCL 2.5 % OP SOLN
OPHTHALMIC | Status: AC
  Filled 2012-07-13: qty 2

## 2012-07-13 MED ORDER — TETRACAINE HCL 0.5 % OP SOLN
OPHTHALMIC | Status: AC
  Filled 2012-07-13: qty 2

## 2012-07-14 ENCOUNTER — Ambulatory Visit (HOSPITAL_COMMUNITY)
Admission: RE | Admit: 2012-07-14 | Discharge: 2012-07-14 | Disposition: A | Discharge status: Home Health | Payer: Medicare Other | Source: Ambulatory Visit | Attending: Ophthalmology | Admitting: Ophthalmology

## 2012-07-14 ENCOUNTER — Encounter (HOSPITAL_COMMUNITY)
Admission: RE | Disposition: A | Discharge status: Home Health | Payer: Self-pay | Source: Ambulatory Visit | Attending: Ophthalmology

## 2012-07-14 ENCOUNTER — Encounter (HOSPITAL_COMMUNITY): Payer: Self-pay | Admitting: Anesthesiology

## 2012-07-14 ENCOUNTER — Ambulatory Visit (HOSPITAL_COMMUNITY): Payer: Medicare Other | Admitting: Anesthesiology

## 2012-07-14 ENCOUNTER — Encounter (HOSPITAL_COMMUNITY): Payer: Self-pay | Admitting: *Deleted

## 2012-07-14 DIAGNOSIS — Z01812 Encounter for preprocedural laboratory examination: Secondary | ICD-10-CM | POA: Insufficient documentation

## 2012-07-14 DIAGNOSIS — I10 Essential (primary) hypertension: Secondary | ICD-10-CM | POA: Insufficient documentation

## 2012-07-14 DIAGNOSIS — E119 Type 2 diabetes mellitus without complications: Secondary | ICD-10-CM | POA: Insufficient documentation

## 2012-07-14 DIAGNOSIS — H2510 Age-related nuclear cataract, unspecified eye: Secondary | ICD-10-CM | POA: Insufficient documentation

## 2012-07-14 HISTORY — PX: CATARACT EXTRACTION W/PHACO: SHX586

## 2012-07-14 SURGERY — PHACOEMULSIFICATION, CATARACT, WITH IOL INSERTION
Anesthesia: Monitor Anesthesia Care | Site: Eye | Laterality: Right | Wound class: Clean

## 2012-07-14 MED ORDER — LIDOCAINE 3.5 % OP GEL OPTIME - NO CHARGE
OPHTHALMIC | Status: DC | PRN
  Administered 2012-07-14: 1 [drp] via OPHTHALMIC

## 2012-07-14 MED ORDER — NEOMYCIN-POLYMYXIN-DEXAMETH 0.1 % OP OINT
TOPICAL_OINTMENT | OPHTHALMIC | Status: DC | PRN
  Administered 2012-07-14: 1 via OPHTHALMIC

## 2012-07-14 MED ORDER — CYCLOPENTOLATE HCL 1 % OP SOLN: 1.0000 [drp] | OPHTHALMIC | Status: AC

## 2012-07-14 MED ORDER — EPINEPHRINE HCL 1 MG/ML IJ SOLN
INTRAOCULAR | Status: DC | PRN
  Administered 2012-07-14: 09:00:00

## 2012-07-14 MED ORDER — BSS IO SOLN
INTRAOCULAR | Status: DC | PRN
  Administered 2012-07-14: 15 mL via INTRAOCULAR

## 2012-07-14 MED ORDER — PROVISC 10 MG/ML IO SOLN
INTRAOCULAR | Status: DC | PRN
  Administered 2012-07-14: 8.5 mg via INTRAOCULAR

## 2012-07-14 MED ORDER — TETRACAINE HCL 0.5 % OP SOLN
1.0000 [drp] | OPHTHALMIC | Status: AC
  Administered 2012-07-14 (×3): 1 [drp] via OPHTHALMIC

## 2012-07-14 MED ORDER — POVIDONE-IODINE 5 % OP SOLN
OPHTHALMIC | Status: DC | PRN
  Administered 2012-07-14: 1 via OPHTHALMIC

## 2012-07-14 MED ORDER — LIDOCAINE HCL 3.5 % OP GEL
1.0000 "application " | Freq: Once | OPHTHALMIC | Status: AC
  Administered 2012-07-14: 1 via OPHTHALMIC

## 2012-07-14 MED ORDER — LIDOCAINE HCL (PF) 1 % IJ SOLN
INTRAMUSCULAR | Status: DC | PRN
  Administered 2012-07-14: .4 mL

## 2012-07-14 MED ORDER — CYCLOPENTOLATE-PHENYLEPHRINE 0.2-1 % OP SOLN
1.0000 [drp] | OPHTHALMIC | Status: AC
  Administered 2012-07-14 (×3): 1 [drp] via OPHTHALMIC

## 2012-07-14 MED ORDER — LACTATED RINGERS IV SOLN
INTRAVENOUS | Status: DC
  Administered 2012-07-14: 1000 mL via INTRAVENOUS

## 2012-07-14 MED ORDER — MIDAZOLAM HCL 2 MG/2ML IJ SOLN
1.0000 mg | INTRAMUSCULAR | Status: DC | PRN
  Administered 2012-07-14: 2 mg via INTRAVENOUS

## 2012-07-14 MED ORDER — MIDAZOLAM HCL 2 MG/2ML IJ SOLN
INTRAMUSCULAR | Status: AC
  Filled 2012-07-14: qty 2

## 2012-07-14 MED ORDER — PHENYLEPHRINE HCL 2.5 % OP SOLN
1.0000 [drp] | OPHTHALMIC | Status: AC
  Administered 2012-07-14 (×3): 1 [drp] via OPHTHALMIC

## 2012-07-14 SURGICAL SUPPLY — 14 items
CLOTH BEACON ORANGE TIMEOUT ST (SAFETY) ×2 IMPLANT
EYE SHIELD UNIVERSAL CLEAR (GAUZE/BANDAGES/DRESSINGS) ×2 IMPLANT
GLOVE BIOGEL PI IND STRL 6.5 (GLOVE) ×1 IMPLANT
GLOVE BIOGEL PI IND STRL 7.0 (GLOVE) ×1 IMPLANT
GLOVE BIOGEL PI INDICATOR 6.5 (GLOVE) ×1
GLOVE BIOGEL PI INDICATOR 7.0 (GLOVE) ×1
GLOVE EXAM NITRILE MD LF STRL (GLOVE) ×2 IMPLANT
GOWN STRL REIN XL XLG (GOWN DISPOSABLE) ×2 IMPLANT
PAD ARMBOARD 7.5X6 YLW CONV (MISCELLANEOUS) ×2 IMPLANT
SIGHTPATH CAT PROC W REG LENS (Ophthalmic Related) ×2 IMPLANT
SYR TB 1ML LL NO SAFETY (SYRINGE) ×2 IMPLANT
TAPE SURG TRANSPORE 1 IN (GAUZE/BANDAGES/DRESSINGS) ×1 IMPLANT
TAPE SURGICAL TRANSPORE 1 IN (GAUZE/BANDAGES/DRESSINGS) ×1
WATER STERILE IRR 250ML POUR (IV SOLUTION) ×2 IMPLANT

## 2012-07-14 NOTE — Transfer of Care (Signed)
Immediate Anesthesia Transfer of Care Note  Patient: Jeremy Mcdaniel  Procedure(s) Performed: Procedure(s) (LRB): CATARACT EXTRACTION PHACO AND INTRAOCULAR LENS PLACEMENT (IOC) (Right)  Patient Location: Shortstay  Anesthesia Type: MAC  Level of Consciousness: awake  Airway & Oxygen Therapy: Patient Spontanous Breathing   Post-op Assessment: Report given to PACU RN, Post -op Vital signs reviewed and stable and Patient moving all extremities  Post vital signs: Reviewed and stable  Complications: No apparent anesthesia complications

## 2012-07-14 NOTE — Anesthesia Postprocedure Evaluation (Signed)
  Anesthesia Post-op Note  Patient: Jeremy Mcdaniel  Procedure(s) Performed: Procedure(s) (LRB): CATARACT EXTRACTION PHACO AND INTRAOCULAR LENS PLACEMENT (IOC) (Right)  Patient Location:  Short Stay  Anesthesia Type: MAC  Level of Consciousness: awake  Airway and Oxygen Therapy: Patient Spontanous Breathing  Post-op Pain: none  Post-op Assessment: Post-op Vital signs reviewed, Patient's Cardiovascular Status Stable, Respiratory Function Stable, Patent Airway, No signs of Nausea or vomiting and Pain level controlled  Post-op Vital Signs: Reviewed and stable  Complications: No apparent anesthesia complications

## 2012-07-14 NOTE — Brief Op Note (Signed)
Pre-Op Dx: Cataract OD Post-Op Dx: Cataract OD Surgeon: Shariq Puig Anesthesia: Topical with MAC Surgery: Cataract Extraction with Intraocular lens Implant OD Implant: Lenstec, Model Softec HD Blood Loss: None Specimen: None Complications: None 

## 2012-07-14 NOTE — Preoperative (Signed)
Beta Blocker     Hr 40's.  Patients primary Md has had pt.  hold Atenolol since 07/07/2102 Pt. States he has not had a dose since the 07/07/2012

## 2012-07-14 NOTE — Anesthesia Procedure Notes (Signed)
Procedure Name: MAC Date/Time: 07/14/2012 7:45 AM Performed by: Franco Nones Pre-anesthesia Checklist: Patient identified, Emergency Drugs available, Suction available, Timeout performed and Patient being monitored Patient Re-evaluated:Patient Re-evaluated prior to inductionOxygen Delivery Method: Nasal Cannula

## 2012-07-14 NOTE — H&P (Signed)
I have reviewed the H&P, the patient was re-examined, and I have identified no interval changes in medical condition and plan of care since the history and physical of record  

## 2012-07-14 NOTE — Anesthesia Preprocedure Evaluation (Addendum)
Anesthesia Evaluation  Patient identified by MRN, date of birth, ID band Patient awake    Reviewed: Allergy & Precautions, H&P , NPO status , Patient's Chart, lab work & pertinent test results, reviewed documented beta blocker date and time   Airway Mallampati: I TM Distance: >3 FB Neck ROM: Full    Dental  (+) Edentulous Upper and Edentulous Lower   Pulmonary neg pulmonary ROS,    Pulmonary exam normal       Cardiovascular hypertension, Pt. on medications and Pt. on home beta blockers Rhythm:Regular Rate:Normal     Neuro/Psych negative neurological ROS  negative psych ROS   GI/Hepatic   Endo/Other  diabetes, Well Controlled, Type 2  Renal/GU      Musculoskeletal   Abdominal Normal abdominal exam  (+)   Peds  Hematology   Anesthesia Other Findings   Reproductive/Obstetrics                           Anesthesia Physical Anesthesia Plan  ASA: III  Anesthesia Plan: MAC   Post-op Pain Management:    Induction: Intravenous  Airway Management Planned: Nasal Cannula  Additional Equipment:   Intra-op Plan:   Post-operative Plan:   Informed Consent: I have reviewed the patients History and Physical, chart, labs and discussed the procedure including the risks, benefits and alternatives for the proposed anesthesia with the patient or authorized representative who has indicated his/her understanding and acceptance.     Plan Discussed with:   Anesthesia Plan Comments:         Anesthesia Quick Evaluation

## 2012-07-14 NOTE — Addendum Note (Signed)
Addendum  created 07/14/12 4098 by Franco Nones, CRNA   Modules edited:Anesthesia Events, Notes Section

## 2012-07-15 NOTE — Op Note (Signed)
NAME:  Jeremy Mcdaniel, Jeremy Mcdaniel                ACCOUNT NO.:  [id number]  MEDICAL RECORD NO.:  [id number]  LOCATION:  [facility]                          FACILITY:  [facility]  PHYSICIAN:  Susanne Greenhouse, MD       DATE OF BIRTH:  1923/08/11  DATE OF PROCEDURE:  07/14/2012 DATE OF DISCHARGE:  07/14/2012                              OPERATIVE REPORT   PREOPERATIVE DIAGNOSIS:  Nuclear cataract, right eye, diagnosis code 366.16.  POSTOPERATIVE DIAGNOSIS:  Nuclear cataract, right eye, diagnosis code 366.16.  OPERATION PERFORMED:  Phacoemulsification with posterior chamber intraocular lens implantation, right eye.  SURGEON:  Susanne Greenhouse, MD.  ANESTHESIA:  Topical with monitored anesthesia care and IV sedation.  OPERATIVE SUMMARY:  In the preoperative area, dilating drops were placed into the right eye.  The patient was then brought into the operating room where she was placed under general anesthesia.  The eye was then prepped and draped.  Beginning with a 75 blade, a paracentesis port was made at the surgeon's 2 o'clock position.  The anterior chamber was then filled with a 1% nonpreserved lidocaine solution with epinephrine.  This was followed by Viscoat to deepen the chamber.  A small fornix-based peritomy was performed superiorly.  Next, a single iris hook was placed through the limbus superiorly.  A 2.4-mm keratome blade was then used to make a clear corneal incision over the iris hook.  A bent cystotome needle and Utrata forceps were used to create a continuous tear capsulotomy.  Hydrodissection was performed using balanced salt solution on a fine cannula.  The lens nucleus was then removed using phacoemulsification in a quadrant cracking technique.  The cortical material was then removed with irrigation and aspiration.  The capsular bag and anterior chamber were refilled with Provisc.  The wound was widened to approximately 3 mm and a posterior chamber intraocular lens was placed into the capsular  bag without difficulty using an [employer] lens injecting system.  A single 10-0 nylon suture was then used to close the incision as well as stromal hydration.  The Provisc was removed from the anterior chamber and capsular bag with irrigation and aspiration.  At this point, the wounds were tested for leak, which were negative.  The anterior chamber remained deep and stable.  The patient tolerated the procedure well.  There were no operative complications, and she awoke from general anesthesia without problem.  No surgical specimens.  Prosthetic device used was a Lenstec posterior chamber lens, model Softec HD, power of 20.0, serial number is [id number].          ______________________________ Susanne Greenhouse, MD     KEH/MEDQ  D:  07/14/2012  T:  07/15/2012  Job:  [id number]

## 2012-07-18 ENCOUNTER — Encounter (HOSPITAL_COMMUNITY): Payer: Self-pay | Admitting: Ophthalmology

## 2012-10-28 ENCOUNTER — Telehealth: Payer: Self-pay | Admitting: Oncology

## 2012-10-28 ENCOUNTER — Telehealth: Payer: Self-pay | Admitting: Internal Medicine

## 2012-10-28 NOTE — Telephone Encounter (Signed)
Pt wife called and requested Dr. Arbutus Ped.

## 2012-10-28 NOTE — Telephone Encounter (Signed)
S/W pt in re NP appt 12/24 @ 9:30 w/Dr. Gaylyn Rong.  Referring Dr. Ralene Ok Dx-Chronic Lymphatic Leukemia Welcome packet at registration.

## 2012-10-28 NOTE — Telephone Encounter (Signed)
LVOM for pt to return call.  °

## 2012-10-31 ENCOUNTER — Telehealth: Payer: Self-pay | Admitting: Internal Medicine

## 2012-10-31 NOTE — Telephone Encounter (Signed)
C/D 10/31/12 for appt. 11/01/12

## 2012-11-01 ENCOUNTER — Ambulatory Visit: Payer: Medicare Other | Admitting: Oncology

## 2012-11-01 ENCOUNTER — Other Ambulatory Visit (HOSPITAL_BASED_OUTPATIENT_CLINIC_OR_DEPARTMENT_OTHER): Payer: Medicare Other | Admitting: Lab

## 2012-11-01 ENCOUNTER — Telehealth: Payer: Self-pay | Admitting: Internal Medicine

## 2012-11-01 ENCOUNTER — Ambulatory Visit: Payer: Medicare Other

## 2012-11-01 ENCOUNTER — Other Ambulatory Visit: Payer: Medicare Other | Admitting: Lab

## 2012-11-01 ENCOUNTER — Encounter: Payer: Self-pay | Admitting: Internal Medicine

## 2012-11-01 ENCOUNTER — Other Ambulatory Visit (HOSPITAL_COMMUNITY)
Admission: RE | Admit: 2012-11-01 | Discharge: 2012-11-01 | Disposition: A | Discharge status: Home / Self Care | Payer: Medicare Other | Source: Ambulatory Visit | Attending: Internal Medicine | Admitting: Internal Medicine

## 2012-11-01 ENCOUNTER — Ambulatory Visit (HOSPITAL_BASED_OUTPATIENT_CLINIC_OR_DEPARTMENT_OTHER): Payer: Medicare Other | Admitting: Internal Medicine

## 2012-11-01 VITALS — BP 144/77 | HR 61 | Temp 96.9°F | Resp 20 | Ht 66.0 in | Wt 190.3 lb

## 2012-11-01 DIAGNOSIS — C9110 Chronic lymphocytic leukemia of B-cell type not having achieved remission: Secondary | ICD-10-CM

## 2012-11-01 LAB — COMPREHENSIVE METABOLIC PANEL (CC13)
ALT: 14 U/L (ref 0–55)
CO2: 26 mEq/L (ref 22–29)
Calcium: 9.5 mg/dL (ref 8.4–10.4)
Chloride: 105 mEq/L (ref 98–107)
Glucose: 73 mg/dl (ref 70–99)
Sodium: 143 mEq/L (ref 136–145)
Total Bilirubin: 0.44 mg/dL (ref 0.20–1.20)
Total Protein: 6.4 g/dL (ref 6.4–8.3)

## 2012-11-01 LAB — CBC WITH DIFFERENTIAL/PLATELET
Basophils Absolute: 0.2 10*3/uL — ABNORMAL HIGH (ref 0.0–0.1)
Eosinophils Absolute: 0.1 10*3/uL (ref 0.0–0.5)
HGB: 14.2 g/dL (ref 13.0–17.1)
LYMPH%: 88.4 % — ABNORMAL HIGH (ref 14.0–49.0)
MCV: 90.1 fL (ref 79.3–98.0)
MONO#: 0.7 10*3/uL (ref 0.1–0.9)
MONO%: 1 % (ref 0.0–14.0)
NEUT#: 7.1 10*3/uL — ABNORMAL HIGH (ref 1.5–6.5)
Platelets: 137 10*3/uL — ABNORMAL LOW (ref 140–400)
RDW: 14.7 % — ABNORMAL HIGH (ref 11.0–14.6)
WBC: 70 10*3/uL (ref 4.0–10.3)

## 2012-11-01 LAB — TECHNOLOGIST REVIEW

## 2012-11-01 LAB — LACTATE DEHYDROGENASE (CC13): LDH: 170 U/L (ref 125–245)

## 2012-11-01 NOTE — Telephone Encounter (Signed)
gv and printed appt schedule for Jan 2014 ° °

## 2012-11-01 NOTE — Patient Instructions (Addendum)
Her lab showed findings consistent with chronic lymphocytic leukemia with recent rapid disease progression. We discussed treatment options including observation versus treatment with chemotherapy and Rituxan. you wanted some time to think about your options. Followup in 3 weeks

## 2012-11-01 NOTE — Progress Notes (Signed)
[facility] [phone]   [phone]  CONSULT NOTE  REFERRING PHYSICIAN: Dr. Ralene Ok  REASON FOR CONSULTATION:  78 years old white male with worsening chronic lymphocytic leukemia.  HPI Jeremy Mcdaniel is a 78 y.o. male was past medical history significant for hypertension, diabetes mellitus, hypercholesterolemia, diverticulosis, chronic kidney disease, hiatal hernia as well as long history of chronic lymphocytic leukemia. The patient mentioned that he was diagnosed with chronic lymphocytic leukemia more than 30 years ago. He was followed by observation and his white blood count has been always less than 20,000. Over the last 2 years he had significant increase in his white blood count and a recent CBC by Dr. Ludwig Clarks on 10/17/2012 showed elevated white blood count to 60,400 with absolute lymphocyte count of 53,000. He referred the patient to me today for evaluation and recommendation regarding treatment of his condition. The patient is feeling fine today with no specific complaints. He mentions that he is very active and has been working in his yard for several hours every day. He lost around 8-10 pounds in the last 12 months. He has no palpable lymphadenopathy. The patient denied having any night sweats or bleeding issues.  He denied having any significant chest pain, shortness breath, cough or hemoptysis. He denied having any recent infection. No specific complaints today. Family history significant for a father who died at age 14 with brain tumor. Mother died from old age and brother had prostate cancer. The patient is married and has 2 sons. 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 He has no history of smoking, alcohol or drug abuse. @SFHPI @  Past Medical History  Diagnosis Date  . Hypertension   . Hypercholesterolemia   . Hernia   . Diabetes mellitus   . Hypercholesterolemia   . Leg cramps     at night     Past Surgical  History  Procedure Date  . Hemorrhoid surgery   . Hernia repair     laparoscopic ventral -had 3 at [facility]. Total of 4 hernia surgeries  . Cataract extraction w/phaco 05/16/2012    Procedure: CATARACT EXTRACTION PHACO AND INTRAOCULAR LENS PLACEMENT (IOC);  Surgeon: Gemma Payor, MD;  Location: [facility];  Service: Ophthalmology;  Laterality: Left;  CDE:17.23  . Cataract extraction w/phaco 07/14/2012    Procedure: CATARACT EXTRACTION PHACO AND INTRAOCULAR LENS PLACEMENT (IOC);  Surgeon: Gemma Payor, MD;  Location: [facility];  Service: Ophthalmology;  Laterality: Right;  CDE=15.85    No family history on file.  Social History History  Substance Use Topics  . Smoking status: Never Smoker   . Smokeless tobacco: Not on file  . Alcohol Use: No    Allergies  Allergen Reactions  . Anesthetics, Amide Rash    Had medicine to put him to sleep for hernia repair -and he broke out in rash . He got benadryl for it  . Azulfidine (Sulfasalazine) Rash  . Codeine Rash  . Penicillins Rash  . Sulfa Antibiotics Rash    Current Outpatient Prescriptions  Medication Sig Dispense Refill  . aspirin EC 81 MG tablet Take 81 mg by mouth daily.       . Cinnamon 500 MG TABS Take 1 tablet by mouth every morning.      Marland Kitchen lisinopril (PRINIVIL,ZESTRIL) 5 MG tablet Take 2.5 mg by mouth daily.      . simvastatin (ZOCOR) 20 MG tablet Take 10 mg by mouth every evening.  Review of Systems  A comprehensive review of systems was negative.  Physical Exam  ZOX:WRUEA, healthy, no distress, well nourished and well developed SKIN: skin color, texture, turgor are normal HEAD: Normocephalic, No masses, lesions, tenderness or abnormalities EYES: normal, PERRLA EARS: External ears normal OROPHARYNX:no exudate and no erythema  NECK: supple, no adenopathy LYMPH:  no palpable lymphadenopathy, no hepatosplenomegaly LUNGS: clear to auscultation  HEART: regular rate & rhythm, no murmurs and no gallops ABDOMEN:abdomen soft,  non-tender, normal bowel sounds and no masses or organomegaly BACK: Back symmetric, no curvature. EXTREMITIES:no edema, no skin discoloration, no clubbing  NEURO: alert & oriented x 3 with fluent speech, no focal motor/sensory deficits  PERFORMANCE STATUS: ECOG 0  LABORATORY DATA: Lab Results  Component Value Date   WBC 70.0* 11/01/2012   HGB 14.2 11/01/2012   HCT 43.4 11/01/2012   MCV 90.1 11/01/2012   PLT 137* 11/01/2012      Chemistry      Component Value Date/Time   NA 143 11/01/2012 1316   NA 145 05/05/2012 1340   K 4.1 11/01/2012 1316   K 4.3 05/05/2012 1340   CL 105 11/01/2012 1316   CL 106 05/05/2012 1340   CO2 26 11/01/2012 1316   CO2 27 05/05/2012 1340   BUN 17.0 11/01/2012 1316   BUN 29* 05/05/2012 1340   CREATININE 1.3 11/01/2012 1316   CREATININE 1.64* 05/05/2012 1340      Component Value Date/Time   CALCIUM 9.5 11/01/2012 1316   CALCIUM 9.8 05/05/2012 1340   ALKPHOS 81 11/01/2012 1316   ALKPHOS 54 02/03/2010 1945   AST 14 11/01/2012 1316   AST 19 02/03/2010 1945   ALT 14 11/01/2012 1316   ALT 14 02/03/2010 1945   BILITOT 0.44 11/01/2012 1316   BILITOT 0.7 02/03/2010 1945       RADIOGRAPHIC STUDIES: No results found.  ASSESSMENT: This is a very pleasant 78 years old white male with history of chronic lymphocytic leukemia diagnosed more than 30 years ago but has an evidence for recent disease progression with significant increase in his total white blood count as well as absolute lymphocytosis.   PLAN: I have a lengthy discussion with the patient today about his current disease status and treatment options. I gave the patient the option of continuing with observation versus proceeding with systemic therapy with bendamustine plus Rituxan for 2 days every 4 weeks for a total of 4 cycles. I discussed with the patient adverse effect of the chemotherapy including but not limited to allergic reaction, alopecia, myelosuppression, nausea and vomiting, liver or renal  dysfunction. The patient would like to have some time to think about his options before making a decision. He would like to wait until the middle of January of 2014. I will arrange for him a followup appointment at that time. I will order repeat CBC, comprehensive metabolic panel and LDH at that time. I also ordered flow cytometry of the peripheral blood today for confirmation of his diagnosis and to rule out any other abnormalities. The patient was advised to call me immediately if he has any concerning symptoms in the interval.  All questions were answered. The patient knows to call the clinic with any problems, questions or concerns. We can certainly see the patient much sooner if necessary.  Thank you so much for allowing me to participate in the care of Jeremy Mcdaniel. I will continue to follow up the patient with you and assist in his care.  I spent 25  minutes counseling the patient face to face. The total time spent in the appointment was 50 minutes.  Sharran Caratachea K. 11/01/2012, 2:30 PM

## 2012-11-24 ENCOUNTER — Telehealth: Payer: Self-pay | Admitting: *Deleted

## 2012-11-24 ENCOUNTER — Other Ambulatory Visit: Payer: Medicare Other

## 2012-11-24 ENCOUNTER — Ambulatory Visit (HOSPITAL_BASED_OUTPATIENT_CLINIC_OR_DEPARTMENT_OTHER): Payer: Medicare Other | Admitting: Internal Medicine

## 2012-11-24 ENCOUNTER — Other Ambulatory Visit (HOSPITAL_BASED_OUTPATIENT_CLINIC_OR_DEPARTMENT_OTHER): Payer: Medicare Other | Admitting: Lab

## 2012-11-24 ENCOUNTER — Encounter: Payer: Self-pay | Admitting: *Deleted

## 2012-11-24 ENCOUNTER — Encounter: Payer: Self-pay | Admitting: Internal Medicine

## 2012-11-24 VITALS — BP 179/82 | HR 67 | Temp 97.1°F | Resp 20 | Ht 66.0 in | Wt 192.0 lb

## 2012-11-24 DIAGNOSIS — C9110 Chronic lymphocytic leukemia of B-cell type not having achieved remission: Secondary | ICD-10-CM

## 2012-11-24 LAB — COMPREHENSIVE METABOLIC PANEL (CC13)
ALT: 11 U/L (ref 0–55)
AST: 14 U/L (ref 5–34)
Alkaline Phosphatase: 82 U/L (ref 40–150)
BUN: 20 mg/dL (ref 7.0–26.0)
Calcium: 9.8 mg/dL (ref 8.4–10.4)
Chloride: 107 mEq/L (ref 98–107)
Creatinine: 1.4 mg/dL — ABNORMAL HIGH (ref 0.7–1.3)
Potassium: 4.2 mEq/L (ref 3.5–5.1)

## 2012-11-24 LAB — CBC WITH DIFFERENTIAL/PLATELET
BASO%: 0.2 % (ref 0.0–2.0)
Basophils Absolute: 0.1 10*3/uL (ref 0.0–0.1)
EOS%: 0.1 % (ref 0.0–7.0)
MCH: 29.6 pg (ref 27.2–33.4)
MCHC: 33.3 g/dL (ref 32.0–36.0)
MCV: 89.1 fL (ref 79.3–98.0)
MONO%: 2.1 % (ref 0.0–14.0)
NEUT%: 9.4 % — ABNORMAL LOW (ref 39.0–75.0)
RDW: 15 % — ABNORMAL HIGH (ref 11.0–14.6)
lymph#: 59.1 10*3/uL — ABNORMAL HIGH (ref 0.9–3.3)

## 2012-11-24 MED ORDER — PROCHLORPERAZINE MALEATE 10 MG PO TABS: 10.0000 mg | ORAL_TABLET | Freq: Four times a day (QID) | ORAL | Status: DC | PRN

## 2012-11-24 NOTE — Progress Notes (Signed)
[facility] [phone]   [phone]  OFFICE PROGRESS NOTE  Ralene Ok, MD [address] [place] [address]  DIAGNOSIS: Progressive chronic lymphocytic leukemia, diagnosed more than 30 years ago but now with short doubling time.  PRIOR THERAPY: None  CURRENT THERAPY: The patient expected to start next week systemic chemotherapy with Rituxan 375 mg/M2 on day 1 and Bendamustine 70 mg/m2 on days 1 and 2 every 4 weeks.   INTERVAL HISTORY: Jeremy Mcdaniel 79 y.o. male returns to the clinic today for followup visit accompanied his wife and son. The patient is feeling fine today with no specific complaints except for mild fatigue. He has been thinking about his treatment options for the progressive chronic lymphocytic leukemia over the last few weeks. He came today for further evaluation and to consider proceeding with his systemic therapy. He denied having any significant weight loss or night sweats. He has no chest pain, shortness of breath, cough or hemoptysis. No palpable lymphadenopathy.  MEDICAL HISTORY: Past Medical History  Diagnosis Date  . Hypertension   . Hypercholesterolemia   . Hernia   . Diabetes mellitus   . Hypercholesterolemia   . Leg cramps     at night     ALLERGIES:  is allergic to anesthetics, amide; azulfidine; codeine; penicillins; and sulfa antibiotics.  MEDICATIONS:  Current Outpatient Prescriptions  Medication Sig Dispense Refill  . aspirin EC 81 MG tablet Take 81 mg by mouth daily.       . Cinnamon 500 MG TABS Take 1 tablet by mouth every morning.      Marland Kitchen lisinopril (PRINIVIL,ZESTRIL) 5 MG tablet Take 2.5 mg by mouth daily.      . simvastatin (ZOCOR) 20 MG tablet Take 10 mg by mouth every evening.        SURGICAL HISTORY:  Past Surgical History  Procedure Date  . Hemorrhoid surgery   . Hernia repair     laparoscopic ventral -[facility] 3 [facility]. Total of 4 hernia surgeries  . Cataract extraction w/phaco  05/16/2012    Procedure: CATARACT EXTRACTION PHACO AND INTRAOCULAR LENS PLACEMENT (IOC);  Surgeon: Gemma Payor, MD;  Location: [facility];  Service: Ophthalmology;  Laterality: Left;  CDE:17.23  . Cataract extraction w/phaco 07/14/2012    Procedure: CATARACT EXTRACTION PHACO AND INTRAOCULAR LENS PLACEMENT (IOC);  Surgeon: Gemma Payor, MD;  Location: [facility];  Service: Ophthalmology;  Laterality: Right;  CDE=15.85    REVIEW OF SYSTEMS:  A comprehensive review of systems was negative except for: Constitutional: positive for fatigue   PHYSICAL EXAMINATION: General appearance: alert, cooperative and no distress Head: Normocephalic, without obvious abnormality, atraumatic Neck: no adenopathy Lymph nodes: Cervical, supraclavicular, and axillary nodes normal. Resp: clear to auscultation bilaterally Cardio: regular rate and rhythm, S1, S2 normal, no murmur, click, rub or gallop GI: soft, non-tender; bowel sounds normal; no masses,  no organomegaly Extremities: extremities normal, atraumatic, no cyanosis or edema Neurologic: Alert and oriented X 3, normal strength and tone. Normal symmetric reflexes. Normal coordination and gait  ECOG PERFORMANCE STATUS: 1 - Symptomatic but completely ambulatory  Blood pressure 179/82, pulse 67, temperature 97.1 F (36.2 C), temperature source Oral, resp. rate 20, height 5\' 6"  (1.676 m), weight 192 lb (87.091 kg).  LABORATORY DATA: Lab Results  Component Value Date   WBC 67.0* 11/24/2012   HGB 14.3 11/24/2012   HCT 43.0 11/24/2012   MCV 89.1 11/24/2012   PLT 142 11/24/2012      Chemistry  Component Value Date/Time   NA 143 11/01/2012 1316   NA 145 05/05/2012 1340   K 4.1 11/01/2012 1316   K 4.3 05/05/2012 1340   CL 105 11/01/2012 1316   CL 106 05/05/2012 1340   CO2 26 11/01/2012 1316   CO2 27 05/05/2012 1340   BUN 17.0 11/01/2012 1316   BUN 29* 05/05/2012 1340   CREATININE 1.3 11/01/2012 1316   CREATININE 1.64* 05/05/2012 1340      Component Value Date/Time    CALCIUM 9.5 11/01/2012 1316   CALCIUM 9.8 05/05/2012 1340   ALKPHOS 81 11/01/2012 1316   ALKPHOS 54 02/03/2010 1945   AST 14 11/01/2012 1316   AST 19 02/03/2010 1945   ALT 14 11/01/2012 1316   ALT 14 02/03/2010 1945   BILITOT 0.44 11/01/2012 1316   BILITOT 0.7 02/03/2010 1945       RADIOGRAPHIC STUDIES: No results found.  ASSESSMENT: This is a very pleasant 79 years old white male with progressive chronic lymphocytic leukemia.  PLAN: I have a lengthy discussion with the patient and his family today about his condition. I recommended for him either continuous observation versus proceeding with systemic therapy with Rituxan 375 mg/M2 on day 1 and bendamustine 70 mg/M2 on days 1 and 2 every 4 weeks. The patient is interested in proceeding with chemotherapy. I discussed with him the adverse effect of this treatment including but not limited to allergic reaction to the Rituxan in addition to mild suppression, alopecia, nausea and vomiting, peripheral neuropathy, liver or renal dysfunction. The patient would like to proceed with treatment as planned. He would have a chemotherapy education class later today. He is expected to start the first cycle of his treatment next week. I will call his pharmacy was Compazine 10 mg by mouth every 6 hours as needed for nausea. He would come back for followup visit in 2 weeks for evaluation and management any adverse effect of his treatment.  All questions were answered. The patient knows to call the clinic with any problems, questions or concerns. We can certainly see the patient much sooner if necessary.  I spent 15 minutes counseling the patient face to face. The total time spent in the appointment was 25 minutes.

## 2012-11-24 NOTE — Telephone Encounter (Signed)
Per staff message and POF I have scheduled appts.  JMW  

## 2012-11-24 NOTE — Patient Instructions (Addendum)
We discussed treatment options again for the chronic lymphocytic leukemia. He would like to proceed with the treatment as planned. First cycle expected next week. Followup in 2 weeks.

## 2012-11-25 ENCOUNTER — Telehealth: Payer: Self-pay | Admitting: Internal Medicine

## 2012-11-25 NOTE — Telephone Encounter (Signed)
appts complete. S/w pt confirming appts for 1/21 and 1/22. Pt will get schedule when he comes in.

## 2012-11-29 ENCOUNTER — Ambulatory Visit (HOSPITAL_BASED_OUTPATIENT_CLINIC_OR_DEPARTMENT_OTHER): Payer: Medicare Other

## 2012-11-29 ENCOUNTER — Other Ambulatory Visit: Payer: Medicare Other | Admitting: Lab

## 2012-11-29 ENCOUNTER — Other Ambulatory Visit (HOSPITAL_BASED_OUTPATIENT_CLINIC_OR_DEPARTMENT_OTHER): Payer: Medicare Other | Admitting: Lab

## 2012-11-29 VITALS — BP 109/60 | HR 73 | Temp 98.0°F

## 2012-11-29 DIAGNOSIS — Z5112 Encounter for antineoplastic immunotherapy: Secondary | ICD-10-CM

## 2012-11-29 DIAGNOSIS — Z5111 Encounter for antineoplastic chemotherapy: Secondary | ICD-10-CM

## 2012-11-29 DIAGNOSIS — C9110 Chronic lymphocytic leukemia of B-cell type not having achieved remission: Secondary | ICD-10-CM

## 2012-11-29 LAB — COMPREHENSIVE METABOLIC PANEL (CC13)
ALT: 11 U/L (ref 0–55)
AST: 15 U/L (ref 5–34)
Albumin: 3.7 g/dL (ref 3.5–5.0)
Calcium: 9.4 mg/dL (ref 8.4–10.4)
Chloride: 108 mEq/L — ABNORMAL HIGH (ref 98–107)
Creatinine: 1.4 mg/dL — ABNORMAL HIGH (ref 0.7–1.3)
Potassium: 4.3 mEq/L (ref 3.5–5.1)

## 2012-11-29 LAB — CBC WITH DIFFERENTIAL/PLATELET
BASO%: 0.2 % (ref 0.0–2.0)
MCHC: 32.9 g/dL (ref 32.0–36.0)
MONO#: 1.4 10*3/uL — ABNORMAL HIGH (ref 0.1–0.9)
RBC: 4.88 10*6/uL (ref 4.20–5.82)
RDW: 14.5 % (ref 11.0–14.6)
WBC: 75.6 10*3/uL (ref 4.0–10.3)
lymph#: 68.6 10*3/uL — ABNORMAL HIGH (ref 0.9–3.3)
nRBC: 0 % (ref 0–0)

## 2012-11-29 MED ORDER — DIPHENHYDRAMINE HCL 25 MG PO CAPS
50.0000 mg | ORAL_CAPSULE | Freq: Once | ORAL | Status: AC
  Administered 2012-11-29: 50 mg via ORAL

## 2012-11-29 MED ORDER — METHYLPREDNISOLONE SODIUM SUCC 125 MG IJ SOLR
125.0000 mg | Freq: Once | INTRAMUSCULAR | Status: AC | PRN
  Administered 2012-11-29: 40 mg via INTRAVENOUS

## 2012-11-29 MED ORDER — SODIUM CHLORIDE 0.9 % IV SOLN
375.0000 mg/m2 | Freq: Once | INTRAVENOUS | Status: AC
  Administered 2012-11-29: 800 mg via INTRAVENOUS
  Filled 2012-11-29: qty 80

## 2012-11-29 MED ORDER — SODIUM CHLORIDE 0.9 % IV SOLN
70.0000 mg/m2 | Freq: Once | INTRAVENOUS | Status: AC
  Administered 2012-11-29: 140 mg via INTRAVENOUS
  Filled 2012-11-29: qty 28

## 2012-11-29 MED ORDER — SODIUM CHLORIDE 0.9 % IV SOLN: Freq: Once | INTRAVENOUS | Status: DC

## 2012-11-29 MED ORDER — ONDANSETRON 8 MG/50ML IVPB (CHCC)
8.0000 mg | Freq: Once | INTRAVENOUS | Status: AC
  Administered 2012-11-29: 8 mg via INTRAVENOUS

## 2012-11-29 MED ORDER — DEXAMETHASONE SODIUM PHOSPHATE 10 MG/ML IJ SOLN
10.0000 mg | Freq: Once | INTRAMUSCULAR | Status: AC
  Administered 2012-11-29: 10 mg via INTRAVENOUS

## 2012-11-29 MED ORDER — ACETAMINOPHEN 325 MG PO TABS
650.0000 mg | ORAL_TABLET | Freq: Once | ORAL | Status: AC
  Administered 2012-11-29: 650 mg via ORAL

## 2012-11-29 NOTE — Progress Notes (Signed)
1149-RN noted that patient was experiencing some shaking after returning from bathroom.  PT stated he just feels "cold".  RN immediately stopped Rituxan.  RN notified Dr. Arbutus Ped and verbal orders for solumedrol given.  Pt stable at 1210 without any obvious shaking.  RN to restart Rituxan in about 30 minutes after solumedrol given. Pt has no complaints at this time. SHK

## 2012-11-29 NOTE — Progress Notes (Signed)
Rate increased to 57ml's/hr for 31 ml's.

## 2012-11-29 NOTE — Patient Instructions (Addendum)
[facility] Discharge Instructions for Patients Receiving Chemotherapy  Today you received the following chemotherapy agents Rituxan and Treanda  To help prevent nausea and vomiting after your treatment, we encourage you to take your nausea medication as prescribed. Begin taking it as directed and take it as often as prescribed for the next 48-72 hours after completion of chemo.   If you develop nausea and vomiting that is not controlled by your nausea medication, call the clinic. If it is after clinic hours your family physician or the after hours number for the clinic or go to the Emergency Department.   BELOW ARE SYMPTOMS THAT SHOULD BE REPORTED IMMEDIATELY:  *FEVER GREATER THAN 100.5 F  *CHILLS WITH OR WITHOUT FEVER  NAUSEA AND VOMITING THAT IS NOT CONTROLLED WITH YOUR NAUSEA MEDICATION  *UNUSUAL SHORTNESS OF BREATH  *UNUSUAL BRUISING OR BLEEDING  TENDERNESS IN MOUTH AND THROAT WITH OR WITHOUT PRESENCE OF ULCERS  *URINARY PROBLEMS  *BOWEL PROBLEMS  UNUSUAL RASH Items with * indicate a potential emergency and should be followed up as soon as possible.

## 2012-11-29 NOTE — Progress Notes (Signed)
Vitals checked 30 min after restarting Rituxan.  Blood pressure decreased to 97/53.  Rituxan stopped.  Notified Dr. Arbutus Ped- continue, however, decrease rate.  Rate decreased.  Pt reports no complaints.  Vitals rechecked after decreasing rate.  Blood pressure increased to 108/62.  Rate continued at 64mL/hr (150mg /hr).  Blood pressure checked 30 mins after running at 150mg /hr.  Increased again to 109/60.  Rate increased back to 200mg /hr.  Vitals remained WNL throughout infusion.  Rate increased per protocol for duration of infusion.

## 2012-11-29 NOTE — Progress Notes (Signed)
rituxan rate increased to 42 ml's/hr for 21 ml's.

## 2012-11-29 NOTE — Progress Notes (Signed)
Rituxan restarted.  Rate to restart at 83 ml's/hr for 42 ml's.

## 2012-11-29 NOTE — Progress Notes (Signed)
Rituxan rate increased to 83 ml's/hr for 42 ml's.

## 2012-11-29 NOTE — Progress Notes (Signed)
Pt started experiencing shaking chills.  Rituxan stopped.  Dr. Arbutus Ped aware.  40 mg Solumedrol given per Dr. Asa Lente orders.  Pt to be monitored for now.

## 2012-11-30 ENCOUNTER — Ambulatory Visit (HOSPITAL_BASED_OUTPATIENT_CLINIC_OR_DEPARTMENT_OTHER): Payer: Medicare Other

## 2012-11-30 VITALS — BP 160/74 | HR 82 | Temp 97.9°F | Resp 20

## 2012-11-30 DIAGNOSIS — C9110 Chronic lymphocytic leukemia of B-cell type not having achieved remission: Secondary | ICD-10-CM

## 2012-11-30 DIAGNOSIS — Z5111 Encounter for antineoplastic chemotherapy: Secondary | ICD-10-CM

## 2012-11-30 MED ORDER — SODIUM CHLORIDE 0.9 % IV SOLN
Freq: Once | INTRAVENOUS | Status: AC
  Administered 2012-11-30: 13:00:00 via INTRAVENOUS

## 2012-11-30 MED ORDER — DEXAMETHASONE SODIUM PHOSPHATE 10 MG/ML IJ SOLN
10.0000 mg | Freq: Once | INTRAMUSCULAR | Status: AC
  Administered 2012-11-30: 10 mg via INTRAVENOUS

## 2012-11-30 MED ORDER — ONDANSETRON 8 MG/50ML IVPB (CHCC)
8.0000 mg | Freq: Once | INTRAVENOUS | Status: AC
  Administered 2012-11-30: 8 mg via INTRAVENOUS

## 2012-11-30 MED ORDER — SODIUM CHLORIDE 0.9 % IV SOLN
70.0000 mg/m2 | Freq: Once | INTRAVENOUS | Status: AC
  Administered 2012-11-30: 140 mg via INTRAVENOUS
  Filled 2012-11-30: qty 28

## 2012-11-30 NOTE — Patient Instructions (Addendum)
[facility] Discharge Instructions for Patients Receiving Chemotherapy  Today you received the following chemotherapy agents; Treanda.  Yesterday you received Rituxan and Treanda.  Remember to hold your blood pressure med, lisinopril, on the first day of chemo and you may take it on your second day or shorter day of chemo.   To help prevent nausea and vomiting after your treatment, we encourage you to take your nausea medication;  Compazine (prochloraperzine) as directed.   If you develop nausea and vomiting that is not controlled by your nausea medication, call the clinic. If it is after clinic hours your family physician or the after hours number for the clinic or go to the Emergency Department.   BELOW ARE SYMPTOMS THAT SHOULD BE REPORTED IMMEDIATELY:  *FEVER GREATER THAN 100.5 F  *CHILLS WITH OR WITHOUT FEVER  NAUSEA AND VOMITING THAT IS NOT CONTROLLED WITH YOUR NAUSEA MEDICATION  *UNUSUAL SHORTNESS OF BREATH  *UNUSUAL BRUISING OR BLEEDING  TENDERNESS IN MOUTH AND THROAT WITH OR WITHOUT PRESENCE OF ULCERS  *URINARY PROBLEMS  *BOWEL PROBLEMS  UNUSUAL RASH Items with * indicate a potential emergency and should be followed up as soon as possible.   One of the nurses will contact you 24 hours after your treatment. Please let the nurse know about any problems that you may have experienced. Feel free to call the clinic you have any questions or concerns. The clinic phone number is [phone].   I have been informed and understand all the instructions given to me. I know to contact the clinic, my physician, or go to the Emergency Department if any problems should occur. I do not have any questions at this time, but understand that I may call the clinic during office hours   should I have any questions or need assistance in obtaining follow up care.    __________________________________________  _____________  __________ Signature of Patient or Authorized  Representative            Date                   Time    __________________________________________ Nurse's Signature

## 2012-11-30 NOTE — Progress Notes (Signed)
Pt presents today w/ reddened facial rash bilat cheeks. Pt denies any itching or discomfort.  No peeling or flaking, rash is flat, sun burn appearing.  Notified Dr. Arbutus Ped and he instructs for pt/wife to call if rash worsens or becomes uncomfortable. Pt and wife verbalized understanding.

## 2012-12-01 ENCOUNTER — Telehealth: Payer: Self-pay | Admitting: *Deleted

## 2012-12-01 NOTE — Telephone Encounter (Signed)
Mild insomnia thinking about finances, but no physical complaints or questions.

## 2012-12-06 ENCOUNTER — Other Ambulatory Visit (HOSPITAL_BASED_OUTPATIENT_CLINIC_OR_DEPARTMENT_OTHER): Payer: Medicare Other | Admitting: Lab

## 2012-12-06 ENCOUNTER — Ambulatory Visit (HOSPITAL_BASED_OUTPATIENT_CLINIC_OR_DEPARTMENT_OTHER): Payer: Medicare Other | Admitting: Physician Assistant

## 2012-12-06 ENCOUNTER — Encounter: Payer: Self-pay | Admitting: Physician Assistant

## 2012-12-06 VITALS — BP 158/70 | HR 77 | Temp 98.3°F | Resp 20 | Ht 66.0 in | Wt 192.6 lb

## 2012-12-06 DIAGNOSIS — C9110 Chronic lymphocytic leukemia of B-cell type not having achieved remission: Secondary | ICD-10-CM

## 2012-12-06 LAB — COMPREHENSIVE METABOLIC PANEL (CC13)
AST: 12 U/L (ref 5–34)
Alkaline Phosphatase: 65 U/L (ref 40–150)
BUN: 28.5 mg/dL — ABNORMAL HIGH (ref 7.0–26.0)
Creatinine: 1.6 mg/dL — ABNORMAL HIGH (ref 0.7–1.3)
Glucose: 101 mg/dl — ABNORMAL HIGH (ref 70–99)
Total Bilirubin: 0.82 mg/dL (ref 0.20–1.20)

## 2012-12-06 LAB — CBC WITH DIFFERENTIAL/PLATELET
Basophils Absolute: 0 10*3/uL (ref 0.0–0.1)
EOS%: 0.9 % (ref 0.0–7.0)
Eosinophils Absolute: 0.1 10*3/uL (ref 0.0–0.5)
HGB: 14.2 g/dL (ref 13.0–17.1)
LYMPH%: 22.7 % (ref 14.0–49.0)
MCH: 29.7 pg (ref 27.2–33.4)
MCV: 85.8 fL (ref 79.3–98.0)
MONO%: 8.2 % (ref 0.0–14.0)
NEUT#: 4.8 10*3/uL (ref 1.5–6.5)
Platelets: 112 10*3/uL — ABNORMAL LOW (ref 140–400)
RDW: 14.8 % — ABNORMAL HIGH (ref 11.0–14.6)

## 2012-12-06 NOTE — Progress Notes (Signed)
[facility] [phone]   Fax:(336) [phone]  OFFICE PROGRESS NOTE  Ralene Ok, MD [address] [place] [address]  DIAGNOSIS: Progressive chronic lymphocytic leukemia, diagnosed more than 30 years ago but now with short doubling time.  PRIOR THERAPY: None  CURRENT THERAPY:  Systemic chemotherapy with Rituxan 375 mg/M2 on day 1 and Bendamustine 70 mg/m2 on days 1 and 2 every 4 weeks. Status post 1 cycle   INTERVAL HISTORY: CURTEZ BRALLIER 79 y.o. male returns to the clinic today for followup visit accompanied his wife and another family member. He tolerated his first cycle of chemotherapy with Rituxan and bendamustine without difficulty. He no problems with fever, chills, nausea, vomiting diarrhea or constipation. He voices no specific complaints today.  He denied having any significant weight loss or night sweats. He has no chest pain, shortness of breath, cough or hemoptysis. No palpable lymphadenopathy.  MEDICAL HISTORY: Past Medical History  Diagnosis Date  . Hypertension   . Hypercholesterolemia   . Hernia   . Diabetes mellitus   . Hypercholesterolemia   . Leg cramps     at night     ALLERGIES:  is allergic to anesthetics, amide; azulfidine; codeine; penicillins; and sulfa antibiotics.  MEDICATIONS:  Current Outpatient Prescriptions  Medication Sig Dispense Refill  . aspirin EC 81 MG tablet Take 81 mg by mouth daily.       . Cinnamon 500 MG TABS Take 1 tablet by mouth every morning.      Marland Kitchen lisinopril (PRINIVIL,ZESTRIL) 5 MG tablet Take 2.5 mg by mouth daily.      . prochlorperazine (COMPAZINE) 10 MG tablet Take 1 tablet (10 mg total) by mouth every 6 (six) hours as needed.  60 tablet  0  . simvastatin (ZOCOR) 20 MG tablet Take 10 mg by mouth every evening.        SURGICAL HISTORY:  Past Surgical History  Procedure Date  . Hemorrhoid surgery   . Hernia repair     laparoscopic ventral -[facility] 3 [facility]. Total of 4 hernia  surgeries  . Cataract extraction w/phaco 05/16/2012    Procedure: CATARACT EXTRACTION PHACO AND INTRAOCULAR LENS PLACEMENT (IOC);  Surgeon: Gemma Payor, MD;  Location: [facility];  Service: Ophthalmology;  Laterality: Left;  CDE:17.23  . Cataract extraction w/phaco 07/14/2012    Procedure: CATARACT EXTRACTION PHACO AND INTRAOCULAR LENS PLACEMENT (IOC);  Surgeon: Gemma Payor, MD;  Location: [facility];  Service: Ophthalmology;  Laterality: Right;  CDE=15.85    REVIEW OF SYSTEMS:  A comprehensive review of systems was negative except for: Constitutional: positive for fatigue   PHYSICAL EXAMINATION: General appearance: alert, cooperative and no distress Head: Normocephalic, without obvious abnormality, atraumatic Neck: no adenopathy Lymph nodes: Cervical, supraclavicular, and axillary nodes normal. Resp: clear to auscultation bilaterally Cardio: regular rate and rhythm, S1, S2 normal, no murmur, click, rub or gallop GI: soft, non-tender; bowel sounds normal; no masses,  no organomegaly Extremities: extremities normal, atraumatic, no cyanosis or edema Neurologic: Alert and oriented X 3, normal strength and tone. Normal symmetric reflexes. Normal coordination and gait  ECOG PERFORMANCE STATUS: 1 - Symptomatic but completely ambulatory  Blood pressure 158/70, pulse 77, temperature 98.3 F (36.8 C), temperature source Oral, resp. rate 20, height 5\' 6"  (1.676 m), weight 192 lb 9.6 oz (87.363 kg).  LABORATORY DATA: Lab Results  Component Value Date   WBC 7.1 12/06/2012   HGB 14.2 12/06/2012   HCT 41.2 12/06/2012   MCV 85.8 12/06/2012  PLT 112* 12/06/2012      Chemistry      Component Value Date/Time   NA 142 12/06/2012 1145   NA 145 05/05/2012 1340   K 4.6 12/06/2012 1145   K 4.3 05/05/2012 1340   CL 108* 12/06/2012 1145   CL 106 05/05/2012 1340   CO2 27 12/06/2012 1145   CO2 27 05/05/2012 1340   BUN 28.5* 12/06/2012 1145   BUN 29* 05/05/2012 1340   CREATININE 1.6* 12/06/2012 1145   CREATININE 1.64*  05/05/2012 1340      Component Value Date/Time   CALCIUM 8.7 12/06/2012 1145   CALCIUM 9.8 05/05/2012 1340   ALKPHOS 65 12/06/2012 1145   ALKPHOS 54 02/03/2010 1945   AST 12 12/06/2012 1145   AST 19 02/03/2010 1945   ALT 13 12/06/2012 1145   ALT 14 02/03/2010 1945   BILITOT 0.82 12/06/2012 1145   BILITOT 0.7 02/03/2010 1945       RADIOGRAPHIC STUDIES: No results found.  ASSESSMENT/PLAN: This is a very pleasant 79 years old white male with progressive chronic lymphocytic leukemia. He is currently being treated with systemic chemotherapy in the form of Rituxan and bendamustine given  every 4 weeks, status post 1 cycle. He tolerated his first cycle without difficulty. Patient was discussed with Dr. Arbutus Ped. He will continue with weekly labs as scheduled. He had a dramatic reduction in his white count previously 75.6 thousand prior to his first cycle of chemotherapy now in normal range at 7.1 thousand He will return in 3 weeks prior to cycle #2 of his systemic chemotherapy with Rituxan at 375 mg per meter squared given on day 1 and bendamustine and 70 mg per meter square given on days one 2 every 4 weeks. The patient is encouraged to increase his fluid intake and he voiced understanding.  Laural Benes, Quan Cybulski E, PA-C   All questions were answered. The patient knows to call the clinic with any problems, questions or concerns. We can certainly see the patient much sooner if necessary.  I spent 20 minutes counseling the patient face to face. The total time spent in the appointment was 30 minutes.

## 2012-12-06 NOTE — Patient Instructions (Addendum)
Continue weekly labs as scheduled Followup in 3 weeks prior to your next cycle of systemic chemotherapy with Rituxan and bendamustine

## 2012-12-07 ENCOUNTER — Telehealth: Payer: Self-pay | Admitting: Internal Medicine

## 2012-12-07 ENCOUNTER — Telehealth: Payer: Self-pay | Admitting: *Deleted

## 2012-12-07 NOTE — Telephone Encounter (Signed)
s.w. pt and advised on 2.18.14 appt time change....pt ok.

## 2012-12-07 NOTE — Telephone Encounter (Signed)
Per staff message I have adjusted appt. JMW

## 2012-12-13 ENCOUNTER — Other Ambulatory Visit (HOSPITAL_BASED_OUTPATIENT_CLINIC_OR_DEPARTMENT_OTHER): Payer: Medicare Other

## 2012-12-13 DIAGNOSIS — C9110 Chronic lymphocytic leukemia of B-cell type not having achieved remission: Secondary | ICD-10-CM

## 2012-12-13 LAB — COMPREHENSIVE METABOLIC PANEL (CC13)
Albumin: 3.6 g/dL (ref 3.5–5.0)
Alkaline Phosphatase: 72 U/L (ref 40–150)
BUN: 19.8 mg/dL (ref 7.0–26.0)
Calcium: 9.3 mg/dL (ref 8.4–10.4)
Creatinine: 1.3 mg/dL (ref 0.7–1.3)
Glucose: 129 mg/dl — ABNORMAL HIGH (ref 70–99)
Potassium: 4.3 mEq/L (ref 3.5–5.1)

## 2012-12-13 LAB — CBC WITH DIFFERENTIAL/PLATELET
Basophils Absolute: 0.1 10*3/uL (ref 0.0–0.1)
Eosinophils Absolute: 0.1 10*3/uL (ref 0.0–0.5)
HCT: 40.8 % (ref 38.4–49.9)
HGB: 14.1 g/dL (ref 13.0–17.1)
MCH: 29.7 pg (ref 27.2–33.4)
MCV: 86.1 fL (ref 79.3–98.0)
MONO%: 10.7 % (ref 0.0–14.0)
NEUT#: 4.1 10*3/uL (ref 1.5–6.5)
NEUT%: 74.8 % (ref 39.0–75.0)
Platelets: 131 10*3/uL — ABNORMAL LOW (ref 140–400)
RDW: 15.2 % — ABNORMAL HIGH (ref 11.0–14.6)

## 2012-12-20 ENCOUNTER — Other Ambulatory Visit (HOSPITAL_BASED_OUTPATIENT_CLINIC_OR_DEPARTMENT_OTHER): Payer: Medicare Other

## 2012-12-20 DIAGNOSIS — C9110 Chronic lymphocytic leukemia of B-cell type not having achieved remission: Secondary | ICD-10-CM

## 2012-12-20 LAB — CBC WITH DIFFERENTIAL/PLATELET
Basophils Absolute: 0 10*3/uL (ref 0.0–0.1)
Eosinophils Absolute: 0.1 10*3/uL (ref 0.0–0.5)
HCT: 38.4 % (ref 38.4–49.9)
HGB: 13.3 g/dL (ref 13.0–17.1)
NEUT#: 2.3 10*3/uL (ref 1.5–6.5)
RDW: 15.1 % — ABNORMAL HIGH (ref 11.0–14.6)
lymph#: 0.7 10*3/uL — ABNORMAL LOW (ref 0.9–3.3)

## 2012-12-20 LAB — COMPREHENSIVE METABOLIC PANEL (CC13)
Albumin: 3.7 g/dL (ref 3.5–5.0)
BUN: 17.7 mg/dL (ref 7.0–26.0)
CO2: 27 mEq/L (ref 22–29)
Calcium: 9.1 mg/dL (ref 8.4–10.4)
Chloride: 107 mEq/L (ref 98–107)
Glucose: 101 mg/dl — ABNORMAL HIGH (ref 70–99)
Potassium: 4.3 mEq/L (ref 3.5–5.1)

## 2012-12-27 ENCOUNTER — Other Ambulatory Visit: Payer: Medicare Other | Admitting: Lab

## 2012-12-27 ENCOUNTER — Encounter: Payer: Self-pay | Admitting: Physician Assistant

## 2012-12-27 ENCOUNTER — Ambulatory Visit (HOSPITAL_BASED_OUTPATIENT_CLINIC_OR_DEPARTMENT_OTHER): Payer: Medicare Other

## 2012-12-27 ENCOUNTER — Encounter: Payer: Self-pay | Admitting: Internal Medicine

## 2012-12-27 ENCOUNTER — Other Ambulatory Visit (HOSPITAL_BASED_OUTPATIENT_CLINIC_OR_DEPARTMENT_OTHER): Payer: Medicare Other | Admitting: Lab

## 2012-12-27 ENCOUNTER — Telehealth: Payer: Self-pay | Admitting: Internal Medicine

## 2012-12-27 ENCOUNTER — Ambulatory Visit (HOSPITAL_BASED_OUTPATIENT_CLINIC_OR_DEPARTMENT_OTHER): Payer: Medicare Other | Admitting: Physician Assistant

## 2012-12-27 VITALS — BP 144/65 | HR 67 | Temp 97.5°F | Resp 18 | Ht 66.0 in | Wt 192.4 lb

## 2012-12-27 VITALS — BP 140/63 | HR 73 | Temp 97.5°F | Resp 16

## 2012-12-27 DIAGNOSIS — C9110 Chronic lymphocytic leukemia of B-cell type not having achieved remission: Secondary | ICD-10-CM

## 2012-12-27 DIAGNOSIS — Z5112 Encounter for antineoplastic immunotherapy: Secondary | ICD-10-CM

## 2012-12-27 DIAGNOSIS — Z5111 Encounter for antineoplastic chemotherapy: Secondary | ICD-10-CM

## 2012-12-27 LAB — CBC WITH DIFFERENTIAL/PLATELET
Basophils Absolute: 0 10*3/uL (ref 0.0–0.1)
EOS%: 1.6 % (ref 0.0–7.0)
Eosinophils Absolute: 0.1 10*3/uL (ref 0.0–0.5)
HGB: 13.9 g/dL (ref 13.0–17.1)
MCH: 29.3 pg (ref 27.2–33.4)
NEUT#: 2.9 10*3/uL (ref 1.5–6.5)
RDW: 15.1 % — ABNORMAL HIGH (ref 11.0–14.6)
WBC: 4.4 10*3/uL (ref 4.0–10.3)
lymph#: 0.9 10*3/uL (ref 0.9–3.3)

## 2012-12-27 LAB — COMPREHENSIVE METABOLIC PANEL (CC13)
Albumin: 3.9 g/dL (ref 3.5–5.0)
Alkaline Phosphatase: 72 U/L (ref 40–150)
Glucose: 98 mg/dl (ref 70–99)
Potassium: 4.4 mEq/L (ref 3.5–5.1)
Sodium: 143 mEq/L (ref 136–145)
Total Protein: 6.5 g/dL (ref 6.4–8.3)

## 2012-12-27 MED ORDER — SODIUM CHLORIDE 0.9 % IV SOLN
Freq: Once | INTRAVENOUS | Status: AC
  Administered 2012-12-27: 12:00:00 via INTRAVENOUS

## 2012-12-27 MED ORDER — DEXAMETHASONE SODIUM PHOSPHATE 10 MG/ML IJ SOLN
10.0000 mg | Freq: Once | INTRAMUSCULAR | Status: AC
  Administered 2012-12-27: 10 mg via INTRAVENOUS

## 2012-12-27 MED ORDER — ONDANSETRON 8 MG/50ML IVPB (CHCC)
8.0000 mg | Freq: Once | INTRAVENOUS | Status: AC
  Administered 2012-12-27: 8 mg via INTRAVENOUS

## 2012-12-27 MED ORDER — SODIUM CHLORIDE 0.9 % IV SOLN
375.0000 mg/m2 | Freq: Once | INTRAVENOUS | Status: AC
  Administered 2012-12-27: 800 mg via INTRAVENOUS
  Filled 2012-12-27: qty 80

## 2012-12-27 MED ORDER — SODIUM CHLORIDE 0.9 % IV SOLN
70.0000 mg/m2 | Freq: Once | INTRAVENOUS | Status: AC
  Administered 2012-12-27: 140 mg via INTRAVENOUS
  Filled 2012-12-27: qty 28

## 2012-12-27 MED ORDER — DIPHENHYDRAMINE HCL 25 MG PO CAPS
50.0000 mg | ORAL_CAPSULE | Freq: Once | ORAL | Status: AC
  Administered 2012-12-27: 50 mg via ORAL

## 2012-12-27 MED ORDER — ACETAMINOPHEN 325 MG PO TABS
650.0000 mg | ORAL_TABLET | Freq: Once | ORAL | Status: AC
  Administered 2012-12-27: 650 mg via ORAL

## 2012-12-27 NOTE — Progress Notes (Signed)
Blood return verified before treanda.

## 2012-12-27 NOTE — Telephone Encounter (Signed)
Gave pt appt for March 2014 lab Md , emailed [facility] regarding chemo

## 2012-12-27 NOTE — Patient Instructions (Addendum)
Continue with weekly labs as scheduled Follow up with Dr. Arbutus Ped in 1 month prior to your next cycle of chemotherapy

## 2012-12-27 NOTE — Patient Instructions (Addendum)
[facility] Discharge Instructions for Patients Receiving Chemotherapy  Today you received the following chemotherapy agents: rituxan, treanda  To help prevent nausea and vomiting after your treatment, we encourage you to take your nausea medication.  Take it as often as prescribed.    If you develop nausea and vomiting that is not controlled by your nausea medication, call the clinic. If it is after clinic hours your family physician or the after hours number for the clinic or go to the Emergency Department.   BELOW ARE SYMPTOMS THAT SHOULD BE REPORTED IMMEDIATELY:  *FEVER GREATER THAN 100.5 F  *CHILLS WITH OR WITHOUT FEVER  NAUSEA AND VOMITING THAT IS NOT CONTROLLED WITH YOUR NAUSEA MEDICATION  *UNUSUAL SHORTNESS OF BREATH  *UNUSUAL BRUISING OR BLEEDING  TENDERNESS IN MOUTH AND THROAT WITH OR WITHOUT PRESENCE OF ULCERS  *URINARY PROBLEMS  *BOWEL PROBLEMS  UNUSUAL RASH Items with * indicate a potential emergency and should be followed up as soon as possible.  Feel free to call the clinic you have any questions or concerns. The clinic phone number is [phone].   I have been informed and understand all the instructions given to me. I know to contact the clinic, my physician, or go to the Emergency Department if any problems should occur. I do not have any questions at this time, but understand that I may call the clinic during office hours   should I have any questions or need assistance in obtaining follow up care.    __________________________________________  _____________  __________ Signature of Patient or Authorized Representative            Date                   Time    __________________________________________ Nurse's Signature    

## 2012-12-27 NOTE — Progress Notes (Signed)
[facility] Telephone:(336) [phone]   Fax:(336) [phone]  OFFICE PROGRESS NOTE  Ralene Ok, MD [address] [place] [address]  DIAGNOSIS: Progressive chronic lymphocytic leukemia, diagnosed more than 30 years ago but now with short doubling time.  PRIOR THERAPY: None  CURRENT THERAPY:  Systemic chemotherapy with Rituxan 375 mg/M2 on day 1 and Bendamustine 70 mg/m2 on days 1 and 2 every 4 weeks. Status post 1 cycle   INTERVAL HISTORY: Jeremy Mcdaniel 79 y.o. male returns to the clinic today for followup visit accompanied his wife and another family member. He tolerated his first cycle of chemotherapy with Rituxan and bendamustine without difficulty. He denied problems with fever, chills, nausea, vomiting diarrhea or constipation. He voices no specific complaints today.  He denied having any significant weight loss or night sweats. He has no chest pain, shortness of breath, cough or hemoptysis. No palpable lymphadenopathy. He presents to proceed with his second cycle of systemic chemotherapy with Rituxan and bendamustine.  MEDICAL HISTORY: Past Medical History  Diagnosis Date  . Hypertension   . Hypercholesterolemia   . Hernia   . Diabetes mellitus   . Hypercholesterolemia   . Leg cramps     at night     ALLERGIES:  is allergic to anesthetics, amide; azulfidine; codeine; penicillins; and sulfa antibiotics.  MEDICATIONS:  Current Outpatient Prescriptions  Medication Sig Dispense Refill  . aspirin EC 81 MG tablet Take 81 mg by mouth daily.       . Cinnamon 500 MG TABS Take 1 tablet by mouth every morning.      Marland Kitchen lisinopril (PRINIVIL,ZESTRIL) 5 MG tablet Take 2.5 mg by mouth daily.      . prochlorperazine (COMPAZINE) 10 MG tablet Take 1 tablet (10 mg total) by mouth every 6 (six) hours as needed.  60 tablet  0  . simvastatin (ZOCOR) 20 MG tablet Take 10 mg by mouth every evening.       No current facility-administered medications for this visit.     SURGICAL HISTORY:  Past Surgical History  Procedure Laterality Date  . Hemorrhoid surgery    . Hernia repair      laparoscopic ventral -[facility] 3 [facility]. Total of 4 hernia surgeries  . Cataract extraction w/phaco  05/16/2012    Procedure: CATARACT EXTRACTION PHACO AND INTRAOCULAR LENS PLACEMENT (IOC);  Surgeon: Gemma Payor, MD;  Location: [facility];  Service: Ophthalmology;  Laterality: Left;  CDE:17.23  . Cataract extraction w/phaco  07/14/2012    Procedure: CATARACT EXTRACTION PHACO AND INTRAOCULAR LENS PLACEMENT (IOC);  Surgeon: Gemma Payor, MD;  Location: [facility];  Service: Ophthalmology;  Laterality: Right;  CDE=15.85    REVIEW OF SYSTEMS:  A comprehensive review of systems was negative.   PHYSICAL EXAMINATION: General appearance: alert, cooperative and no distress Head: Normocephalic, without obvious abnormality, atraumatic Neck: no adenopathy Lymph nodes: Cervical, supraclavicular, and axillary nodes normal. Resp: clear to auscultation bilaterally Cardio: regular rate and rhythm, S1, S2 normal, no murmur, click, rub or gallop GI: soft, non-tender; bowel sounds normal; no masses,  no organomegaly Extremities: extremities normal, atraumatic, no cyanosis or edema Neurologic: Alert and oriented X 3, normal strength and tone. Normal symmetric reflexes. Normal coordination and gait  ECOG PERFORMANCE STATUS: 1 - Symptomatic but completely ambulatory  Blood pressure 144/65, pulse 67, temperature 97.5 F (36.4 C), temperature source Oral, resp. rate 18, height 5\' 6"  (1.676 m), weight 192 lb 6.4 oz (87.272 kg).  LABORATORY DATA: Lab Results  Component Value Date   WBC 4.4 12/27/2012   HGB 13.9 12/27/2012   HCT 40.8 12/27/2012   MCV 85.9 12/27/2012   PLT 102* 12/27/2012      Chemistry      Component Value Date/Time   NA 142 12/20/2012 1142   NA 145 05/05/2012 1340   K 4.3 12/20/2012 1142   K 4.3 05/05/2012 1340   CL 107 12/20/2012 1142   CL 106 05/05/2012 1340   CO2 27 12/20/2012 1142   CO2  27 05/05/2012 1340   BUN 17.7 12/20/2012 1142   BUN 29* 05/05/2012 1340   CREATININE 1.2 12/20/2012 1142   CREATININE 1.64* 05/05/2012 1340      Component Value Date/Time   CALCIUM 9.1 12/20/2012 1142   CALCIUM 9.8 05/05/2012 1340   ALKPHOS 68 12/20/2012 1142   ALKPHOS 54 02/03/2010 1945   AST 16 12/20/2012 1142   AST 19 02/03/2010 1945   ALT 12 12/20/2012 1142   ALT 14 02/03/2010 1945   BILITOT 0.56 12/20/2012 1142   BILITOT 0.7 02/03/2010 1945       RADIOGRAPHIC STUDIES: No results found.  ASSESSMENT/PLAN: This is a very pleasant 79 years old white male with progressive chronic lymphocytic leukemia. He is currently being treated with systemic chemotherapy in the form of Rituxan at 375 mg per meter squared given on day 1 and bendamustine at 70 mg per meter squared given on days 1 and 2 every 4 weeks, status post 1 cycle. He tolerated his first cycle without difficulty. Patient was discussed with Dr. Arbutus Ped. He will continue with weekly labs as scheduled. Patient's white count remains in normal range with a total white count of 4.4. He'll proceed with his second cycle of systemic chemotherapy with Rituxan and bendamustine. He'll followup with Dr. Arbutus Ped prior to cycle #3 in 4 weeks.   Laural Benes, Eusebia Grulke E, PA-C   All questions were answered. The patient knows to call the clinic with any problems, questions or concerns. We can certainly see the patient much sooner if necessary.  I spent 20 minutes counseling the patient face to face. The total time spent in the appointment was 30 minutes.

## 2012-12-28 ENCOUNTER — Ambulatory Visit (HOSPITAL_BASED_OUTPATIENT_CLINIC_OR_DEPARTMENT_OTHER): Payer: Medicare Other

## 2012-12-28 VITALS — BP 145/67 | HR 68 | Temp 98.0°F

## 2012-12-28 DIAGNOSIS — C9110 Chronic lymphocytic leukemia of B-cell type not having achieved remission: Secondary | ICD-10-CM

## 2012-12-28 DIAGNOSIS — Z5111 Encounter for antineoplastic chemotherapy: Secondary | ICD-10-CM

## 2012-12-28 MED ORDER — SODIUM CHLORIDE 0.9 % IV SOLN
70.0000 mg/m2 | Freq: Once | INTRAVENOUS | Status: AC
  Administered 2012-12-28: 140 mg via INTRAVENOUS
  Filled 2012-12-28: qty 28

## 2012-12-28 MED ORDER — DEXAMETHASONE SODIUM PHOSPHATE 10 MG/ML IJ SOLN
10.0000 mg | Freq: Once | INTRAMUSCULAR | Status: AC
  Administered 2012-12-28: 10 mg via INTRAVENOUS

## 2012-12-28 MED ORDER — ONDANSETRON 8 MG/50ML IVPB (CHCC)
8.0000 mg | Freq: Once | INTRAVENOUS | Status: AC
  Administered 2012-12-28: 8 mg via INTRAVENOUS

## 2012-12-29 ENCOUNTER — Telehealth: Payer: Self-pay | Admitting: *Deleted

## 2012-12-29 NOTE — Telephone Encounter (Signed)
Per staff message and POF I have scheduled appts.  JMW  

## 2012-12-30 ENCOUNTER — Telehealth: Payer: Self-pay | Admitting: Internal Medicine

## 2012-12-30 NOTE — Telephone Encounter (Signed)
Talked to patient and gave him appt for February and MArch 2014, advised pt to get calendar app

## 2013-01-03 ENCOUNTER — Other Ambulatory Visit (HOSPITAL_BASED_OUTPATIENT_CLINIC_OR_DEPARTMENT_OTHER): Payer: Medicare Other

## 2013-01-03 ENCOUNTER — Telehealth: Payer: Self-pay | Admitting: Internal Medicine

## 2013-01-03 DIAGNOSIS — C9110 Chronic lymphocytic leukemia of B-cell type not having achieved remission: Secondary | ICD-10-CM

## 2013-01-03 LAB — CBC WITH DIFFERENTIAL/PLATELET
BASO%: 0.6 % (ref 0.0–2.0)
EOS%: 2.4 % (ref 0.0–7.0)
HCT: 38.8 % (ref 38.4–49.9)
MCH: 30.5 pg (ref 27.2–33.4)
MCHC: 35.4 g/dL (ref 32.0–36.0)
MONO%: 13.7 % (ref 0.0–14.0)
NEUT%: 64.8 % (ref 39.0–75.0)
RDW: 16 % — ABNORMAL HIGH (ref 11.0–14.6)
lymph#: 0.8 10*3/uL — ABNORMAL LOW (ref 0.9–3.3)

## 2013-01-03 LAB — COMPREHENSIVE METABOLIC PANEL (CC13)
ALT: 14 U/L (ref 0–55)
AST: 16 U/L (ref 5–34)
Alkaline Phosphatase: 72 U/L (ref 40–150)
Calcium: 9.4 mg/dL (ref 8.4–10.4)
Chloride: 107 mEq/L (ref 98–107)
Creatinine: 1.4 mg/dL — ABNORMAL HIGH (ref 0.7–1.3)

## 2013-01-03 NOTE — Telephone Encounter (Signed)
Talked to to pt and she is aware of appt on March 2014 lab, ML and chemo

## 2013-01-09 ENCOUNTER — Telehealth: Payer: Self-pay | Admitting: Internal Medicine

## 2013-01-10 ENCOUNTER — Other Ambulatory Visit: Payer: Medicare Other

## 2013-01-12 ENCOUNTER — Other Ambulatory Visit (HOSPITAL_BASED_OUTPATIENT_CLINIC_OR_DEPARTMENT_OTHER): Payer: Medicare Other | Admitting: Lab

## 2013-01-12 DIAGNOSIS — C9110 Chronic lymphocytic leukemia of B-cell type not having achieved remission: Secondary | ICD-10-CM

## 2013-01-12 LAB — CBC WITH DIFFERENTIAL/PLATELET
Eosinophils Absolute: 0.1 10*3/uL (ref 0.0–0.5)
HCT: 38.2 % — ABNORMAL LOW (ref 38.4–49.9)
MCH: 29.8 pg (ref 27.2–33.4)
MCHC: 34.4 g/dL (ref 32.0–36.0)
MCV: 86.8 fL (ref 79.3–98.0)
Platelets: 159 10*3/uL (ref 140–400)
lymph#: 1 10*3/uL (ref 0.9–3.3)

## 2013-01-12 LAB — COMPREHENSIVE METABOLIC PANEL (CC13)
Albumin: 3.7 g/dL (ref 3.5–5.0)
Alkaline Phosphatase: 69 U/L (ref 40–150)
CO2: 27 mEq/L (ref 22–29)
Calcium: 9.3 mg/dL (ref 8.4–10.4)
Chloride: 109 mEq/L — ABNORMAL HIGH (ref 98–107)
Glucose: 117 mg/dl — ABNORMAL HIGH (ref 70–99)
Potassium: 4.5 mEq/L (ref 3.5–5.1)
Sodium: 144 mEq/L (ref 136–145)
Total Protein: 6.4 g/dL (ref 6.4–8.3)

## 2013-01-17 ENCOUNTER — Other Ambulatory Visit (HOSPITAL_BASED_OUTPATIENT_CLINIC_OR_DEPARTMENT_OTHER): Payer: Medicare Other

## 2013-01-17 DIAGNOSIS — C9110 Chronic lymphocytic leukemia of B-cell type not having achieved remission: Secondary | ICD-10-CM

## 2013-01-17 LAB — CBC WITH DIFFERENTIAL/PLATELET
Eosinophils Absolute: 0.1 10*3/uL (ref 0.0–0.5)
MCV: 86.1 fL (ref 79.3–98.0)
MONO%: 15.5 % — ABNORMAL HIGH (ref 0.0–14.0)
NEUT#: 1.9 10*3/uL (ref 1.5–6.5)
RBC: 4.29 10*6/uL (ref 4.20–5.82)
RDW: 16 % — ABNORMAL HIGH (ref 11.0–14.6)
WBC: 3.7 10*3/uL — ABNORMAL LOW (ref 4.0–10.3)

## 2013-01-17 LAB — COMPREHENSIVE METABOLIC PANEL (CC13)
AST: 17 U/L (ref 5–34)
Albumin: 3.4 g/dL — ABNORMAL LOW (ref 3.5–5.0)
Alkaline Phosphatase: 75 U/L (ref 40–150)
Glucose: 147 mg/dl — ABNORMAL HIGH (ref 70–99)
Potassium: 3.9 mEq/L (ref 3.5–5.1)
Sodium: 144 mEq/L (ref 136–145)
Total Protein: 6.1 g/dL — ABNORMAL LOW (ref 6.4–8.3)

## 2013-01-24 ENCOUNTER — Ambulatory Visit (HOSPITAL_BASED_OUTPATIENT_CLINIC_OR_DEPARTMENT_OTHER): Payer: Medicare Other

## 2013-01-24 ENCOUNTER — Telehealth: Payer: Self-pay | Admitting: Internal Medicine

## 2013-01-24 ENCOUNTER — Other Ambulatory Visit (HOSPITAL_BASED_OUTPATIENT_CLINIC_OR_DEPARTMENT_OTHER): Payer: Medicare Other | Admitting: Lab

## 2013-01-24 ENCOUNTER — Ambulatory Visit: Payer: Medicare Other

## 2013-01-24 ENCOUNTER — Encounter: Payer: Self-pay | Admitting: Internal Medicine

## 2013-01-24 ENCOUNTER — Ambulatory Visit (HOSPITAL_BASED_OUTPATIENT_CLINIC_OR_DEPARTMENT_OTHER): Payer: Medicare Other | Admitting: Internal Medicine

## 2013-01-24 VITALS — BP 148/76 | HR 67 | Temp 97.0°F | Resp 18 | Ht 66.0 in | Wt 192.7 lb

## 2013-01-24 VITALS — BP 142/64 | HR 58 | Temp 97.9°F | Resp 18

## 2013-01-24 DIAGNOSIS — C9110 Chronic lymphocytic leukemia of B-cell type not having achieved remission: Secondary | ICD-10-CM

## 2013-01-24 DIAGNOSIS — Z5111 Encounter for antineoplastic chemotherapy: Secondary | ICD-10-CM

## 2013-01-24 DIAGNOSIS — Z5112 Encounter for antineoplastic immunotherapy: Secondary | ICD-10-CM

## 2013-01-24 LAB — COMPREHENSIVE METABOLIC PANEL (CC13)
Alkaline Phosphatase: 71 U/L (ref 40–150)
Glucose: 101 mg/dl — ABNORMAL HIGH (ref 70–99)
Sodium: 143 mEq/L (ref 136–145)
Total Bilirubin: 0.51 mg/dL (ref 0.20–1.20)
Total Protein: 6.3 g/dL — ABNORMAL LOW (ref 6.4–8.3)

## 2013-01-24 LAB — CBC WITH DIFFERENTIAL/PLATELET
EOS%: 2.4 % (ref 0.0–7.0)
MCH: 30.4 pg (ref 27.2–33.4)
MCV: 87.4 fL (ref 79.3–98.0)
MONO%: 12.6 % (ref 0.0–14.0)
RBC: 4.21 10*6/uL (ref 4.20–5.82)
RDW: 16.1 % — ABNORMAL HIGH (ref 11.0–14.6)
nRBC: 0 % (ref 0–0)

## 2013-01-24 MED ORDER — ACETAMINOPHEN 325 MG PO TABS
650.0000 mg | ORAL_TABLET | Freq: Once | ORAL | Status: AC
  Administered 2013-01-24: 650 mg via ORAL

## 2013-01-24 MED ORDER — SODIUM CHLORIDE 0.9 % IV SOLN
70.0000 mg/m2 | Freq: Once | INTRAVENOUS | Status: AC
  Administered 2013-01-24: 140 mg via INTRAVENOUS
  Filled 2013-01-24: qty 28

## 2013-01-24 MED ORDER — SODIUM CHLORIDE 0.9 % IV SOLN
Freq: Once | INTRAVENOUS | Status: AC
  Administered 2013-01-24: 11:00:00 via INTRAVENOUS

## 2013-01-24 MED ORDER — DIPHENHYDRAMINE HCL 25 MG PO CAPS
50.0000 mg | ORAL_CAPSULE | Freq: Once | ORAL | Status: AC
  Administered 2013-01-24: 50 mg via ORAL

## 2013-01-24 MED ORDER — DEXAMETHASONE SODIUM PHOSPHATE 10 MG/ML IJ SOLN
10.0000 mg | Freq: Once | INTRAMUSCULAR | Status: AC
  Administered 2013-01-24: 10 mg via INTRAVENOUS

## 2013-01-24 MED ORDER — SODIUM CHLORIDE 0.9 % IV SOLN
375.0000 mg/m2 | Freq: Once | INTRAVENOUS | Status: AC
  Administered 2013-01-24: 800 mg via INTRAVENOUS
  Filled 2013-01-24: qty 80

## 2013-01-24 MED ORDER — ONDANSETRON 8 MG/50ML IVPB (CHCC)
8.0000 mg | Freq: Once | INTRAVENOUS | Status: AC
  Administered 2013-01-24: 8 mg via INTRAVENOUS

## 2013-01-24 NOTE — Telephone Encounter (Signed)
Gave pt appt for lab and MD on April 2014 °

## 2013-01-24 NOTE — Patient Instructions (Signed)
[facility] Discharge Instructions for Patients Receiving Chemotherapy  Today you received the following chemotherapy agents :  Rituxan,  Treanda.  To help prevent nausea and vomiting after your treatment, we encourage you to take your nausea medication as instructed by your physician.    If you develop nausea and vomiting that is not controlled by your nausea medication, call the clinic. If it is after clinic hours your family physician or the after hours number for the clinic or go to the Emergency Department.   BELOW ARE SYMPTOMS THAT SHOULD BE REPORTED IMMEDIATELY:  *FEVER GREATER THAN 100.5 F  *CHILLS WITH OR WITHOUT FEVER  NAUSEA AND VOMITING THAT IS NOT CONTROLLED WITH YOUR NAUSEA MEDICATION  *UNUSUAL SHORTNESS OF BREATH  *UNUSUAL BRUISING OR BLEEDING  TENDERNESS IN MOUTH AND THROAT WITH OR WITHOUT PRESENCE OF ULCERS  *URINARY PROBLEMS  *BOWEL PROBLEMS  UNUSUAL RASH Items with * indicate a potential emergency and should be followed up as soon as possible.  One of the nurses will contact you 24 hours after your treatment. Please let the nurse know about any problems that you may have experienced. Feel free to call the clinic you have any questions or concerns. The clinic phone number is [phone].   I have been informed and understand all the instructions given to me. I know to contact the clinic, my physician, or go to the Emergency Department if any problems should occur. I do not have any questions at this time, but understand that I may call the clinic during office hours   should I have any questions or need assistance in obtaining follow up care.    __________________________________________  _____________  __________ Signature of Patient or Authorized Representative            Date                   Time    __________________________________________ Nurse's Signature

## 2013-01-24 NOTE — Progress Notes (Signed)
[facility] Telephone:(336) [phone]   Fax:(336) [phone]  OFFICE PROGRESS NOTE  Ralene Ok, MD [address] [place] [address]  DIAGNOSIS: Progressive chronic lymphocytic leukemia, diagnosed more than 30 years ago but now with short doubling time.   PRIOR THERAPY: None   CURRENT THERAPY: Systemic chemotherapy with Rituxan 375 mg/M2 on day 1 and Bendamustine 70 mg/m2 on days 1 and 2 every 4 weeks. Status post 2 cycles.  INTERVAL HISTORY: Jeremy Mcdaniel 79 y.o. male returns to the clinic today for followup visit accompanied by his wife and son. The patient tolerated the last 2 cycle of his treatment with Rituxan and bendamustine fairly well. He denied having any significant alopecia, no nausea or vomiting, no fever or chills. He had significant improvement in his leukocytosis immediately after the first cycle. He denied having any significant weight loss or night sweats. He denied having any palpable lymphadenopathy. He is here today to start cycle #3 of his chemotherapy.  MEDICAL HISTORY: Past Medical History  Diagnosis Date  . Hypertension   . Hypercholesterolemia   . Hernia   . Diabetes mellitus   . Hypercholesterolemia   . Leg cramps     at night     ALLERGIES:  is allergic to anesthetics, amide; azulfidine; codeine; penicillins; and sulfa antibiotics.  MEDICATIONS:  Current Outpatient Prescriptions  Medication Sig Dispense Refill  . aspirin EC 81 MG tablet Take 81 mg by mouth daily.       . Cinnamon 500 MG TABS Take 1 tablet by mouth every morning.      Marland Kitchen lisinopril (PRINIVIL,ZESTRIL) 5 MG tablet Take 2.5 mg by mouth daily.      . prochlorperazine (COMPAZINE) 10 MG tablet Take 1 tablet (10 mg total) by mouth every 6 (six) hours as needed.  60 tablet  0  . simvastatin (ZOCOR) 20 MG tablet Take 10 mg by mouth every evening.       No current facility-administered medications for this visit.    SURGICAL HISTORY:  Past Surgical History    Procedure Laterality Date  . Hemorrhoid surgery    . Hernia repair      laparoscopic ventral -[facility] 3 [facility]. Total of 4 hernia surgeries  . Cataract extraction w/phaco  05/16/2012    Procedure: CATARACT EXTRACTION PHACO AND INTRAOCULAR LENS PLACEMENT (IOC);  Surgeon: Gemma Payor, MD;  Location: [facility];  Service: Ophthalmology;  Laterality: Left;  CDE:17.23  . Cataract extraction w/phaco  07/14/2012    Procedure: CATARACT EXTRACTION PHACO AND INTRAOCULAR LENS PLACEMENT (IOC);  Surgeon: Gemma Payor, MD;  Location: [facility];  Service: Ophthalmology;  Laterality: Right;  CDE=15.85    REVIEW OF SYSTEMS:  A comprehensive review of systems was negative.   PHYSICAL EXAMINATION: General appearance: alert, cooperative and no distress Head: Normocephalic, without obvious abnormality, atraumatic Neck: no adenopathy Lymph nodes: Cervical, supraclavicular, and axillary nodes normal. Resp: clear to auscultation bilaterally Cardio: regular rate and rhythm, S1, S2 normal, no murmur, click, rub or gallop GI: soft, non-tender; bowel sounds normal; no masses,  no organomegaly Extremities: extremities normal, atraumatic, no cyanosis or edema Neurologic: Alert and oriented X 3, normal strength and tone. Normal symmetric reflexes. Normal coordination and gait  ECOG PERFORMANCE STATUS: 1 - Symptomatic but completely ambulatory  There were no vitals taken for this visit.  LABORATORY DATA: Lab Results  Component Value Date   WBC 3.8* 01/24/2013   HGB 12.8* 01/24/2013   HCT 36.8* 01/24/2013   MCV  87.4 01/24/2013   PLT 111* 01/24/2013      Chemistry      Component Value Date/Time   NA 144 01/17/2013 1056   NA 145 05/05/2012 1340   K 3.9 01/17/2013 1056   K 4.3 05/05/2012 1340   CL 109* 01/17/2013 1056   CL 106 05/05/2012 1340   CO2 26 01/17/2013 1056   CO2 27 05/05/2012 1340   BUN 17.3 01/17/2013 1056   BUN 29* 05/05/2012 1340   CREATININE 1.3 01/17/2013 1056   CREATININE 1.64* 05/05/2012 1340      Component Value  Date/Time   CALCIUM 9.1 01/17/2013 1056   CALCIUM 9.8 05/05/2012 1340   ALKPHOS 75 01/17/2013 1056   ALKPHOS 54 02/03/2010 1945   AST 17 01/17/2013 1056   AST 19 02/03/2010 1945   ALT 12 01/17/2013 1056   ALT 14 02/03/2010 1945   BILITOT 0.62 01/17/2013 1056   BILITOT 0.7 02/03/2010 1945       RADIOGRAPHIC STUDIES: No results found.  ASSESSMENT: This is a very pleasant 79 years old white male with progressive chronic lymphocytic leukemia currently undergoing systemic chemotherapy with Rituxan and bendamustine status post 2 cycles with significant improvement in his disease. The patient is also tolerating his treatment fairly well with no significant adverse effects.  PLAN: I discussed the lab result with the patient today. I recommended for him to proceed with cycle #3 today as scheduled. He would come back for followup visit in one month's for reevaluation and repeat CBC, comprehensive metabolic panel and LDH. I would discontinue his treatment after this cycle. He was advised to call immediately if he has any concerning symptoms in the interval.  All questions were answered. The patient knows to call the clinic with any problems, questions or concerns. We can certainly see the patient much sooner if necessary.  I spent 15 minutes counseling the patient face to face. The total time spent in the appointment was 25 minutes.

## 2013-01-24 NOTE — Patient Instructions (Addendum)
Your labwork as well today to proceed with chemotherapy. Followup visit in one month.

## 2013-01-25 ENCOUNTER — Ambulatory Visit (HOSPITAL_BASED_OUTPATIENT_CLINIC_OR_DEPARTMENT_OTHER): Payer: Medicare Other

## 2013-01-25 VITALS — BP 148/70 | HR 65 | Temp 98.4°F | Resp 20

## 2013-01-25 DIAGNOSIS — Z5111 Encounter for antineoplastic chemotherapy: Secondary | ICD-10-CM

## 2013-01-25 DIAGNOSIS — C9110 Chronic lymphocytic leukemia of B-cell type not having achieved remission: Secondary | ICD-10-CM

## 2013-01-25 MED ORDER — ONDANSETRON 8 MG/50ML IVPB (CHCC)
8.0000 mg | Freq: Once | INTRAVENOUS | Status: AC
  Administered 2013-01-25: 8 mg via INTRAVENOUS

## 2013-01-25 MED ORDER — HEPARIN SOD (PORK) LOCK FLUSH 100 UNIT/ML IV SOLN
500.0000 [IU] | Freq: Once | INTRAVENOUS | Status: DC | PRN
  Filled 2013-01-25: qty 5

## 2013-01-25 MED ORDER — SODIUM CHLORIDE 0.9 % IJ SOLN
10.0000 mL | INTRAMUSCULAR | Status: DC | PRN
  Filled 2013-01-25: qty 10

## 2013-01-25 MED ORDER — ALTEPLASE 2 MG IJ SOLR
2.0000 mg | Freq: Once | INTRAMUSCULAR | Status: DC | PRN
  Filled 2013-01-25: qty 2

## 2013-01-25 MED ORDER — SODIUM CHLORIDE 0.9 % IJ SOLN
3.0000 mL | INTRAMUSCULAR | Status: DC | PRN
  Filled 2013-01-25: qty 10

## 2013-01-25 MED ORDER — HEPARIN SOD (PORK) LOCK FLUSH 100 UNIT/ML IV SOLN
250.0000 [IU] | Freq: Once | INTRAVENOUS | Status: DC | PRN
  Filled 2013-01-25: qty 5

## 2013-01-25 MED ORDER — SODIUM CHLORIDE 0.9 % IV SOLN
Freq: Once | INTRAVENOUS | Status: AC
  Administered 2013-01-25: 14:00:00 via INTRAVENOUS

## 2013-01-25 MED ORDER — DEXAMETHASONE SODIUM PHOSPHATE 10 MG/ML IJ SOLN
10.0000 mg | Freq: Once | INTRAMUSCULAR | Status: AC
  Administered 2013-01-25: 10 mg via INTRAVENOUS

## 2013-01-25 MED ORDER — SODIUM CHLORIDE 0.9 % IV SOLN
70.0000 mg/m2 | Freq: Once | INTRAVENOUS | Status: AC
  Administered 2013-01-25: 140 mg via INTRAVENOUS
  Filled 2013-01-25: qty 28

## 2013-01-25 NOTE — Patient Instructions (Signed)
Patient aware of next appointment; discharged home with no complaints. 

## 2013-02-23 ENCOUNTER — Ambulatory Visit (HOSPITAL_BASED_OUTPATIENT_CLINIC_OR_DEPARTMENT_OTHER): Payer: Medicare Other | Admitting: Internal Medicine

## 2013-02-23 ENCOUNTER — Encounter: Payer: Self-pay | Admitting: Internal Medicine

## 2013-02-23 ENCOUNTER — Telehealth: Payer: Self-pay | Admitting: Internal Medicine

## 2013-02-23 ENCOUNTER — Other Ambulatory Visit (HOSPITAL_BASED_OUTPATIENT_CLINIC_OR_DEPARTMENT_OTHER): Payer: Medicare Other | Admitting: Lab

## 2013-02-23 VITALS — BP 150/77 | HR 69 | Temp 96.9°F | Resp 18 | Ht 66.0 in | Wt 190.7 lb

## 2013-02-23 DIAGNOSIS — C9110 Chronic lymphocytic leukemia of B-cell type not having achieved remission: Secondary | ICD-10-CM

## 2013-02-23 LAB — CBC WITH DIFFERENTIAL/PLATELET
BASO%: 1.2 % (ref 0.0–2.0)
Basophils Absolute: 0.1 10*3/uL (ref 0.0–0.1)
EOS%: 5 % (ref 0.0–7.0)
HGB: 13.8 g/dL (ref 13.0–17.1)
MCH: 30.1 pg (ref 27.2–33.4)
MCHC: 34.7 g/dL (ref 32.0–36.0)
RDW: 15.5 % — ABNORMAL HIGH (ref 11.0–14.6)
lymph#: 0.9 10*3/uL (ref 0.9–3.3)

## 2013-02-23 LAB — COMPREHENSIVE METABOLIC PANEL (CC13)
ALT: 12 U/L (ref 0–55)
AST: 19 U/L (ref 5–34)
Albumin: 3.6 g/dL (ref 3.5–5.0)
Alkaline Phosphatase: 84 U/L (ref 40–150)
Potassium: 4.7 mEq/L (ref 3.5–5.1)
Sodium: 139 mEq/L (ref 136–145)
Total Protein: 6.6 g/dL (ref 6.4–8.3)

## 2013-02-23 LAB — LACTATE DEHYDROGENASE (CC13): LDH: 253 U/L — ABNORMAL HIGH (ref 125–245)

## 2013-02-23 NOTE — Progress Notes (Signed)
[facility] Telephone:(336) [phone]   Fax:(336) [phone]  OFFICE PROGRESS NOTE  Ralene Ok, MD [address] [place] [address]  DIAGNOSIS: Progressive chronic lymphocytic leukemia, diagnosed more than 30 years ago but now with short doubling time.   PRIOR THERAPY: None   CURRENT THERAPY: Systemic chemotherapy with Rituxan 375 mg/M2 on day 1 and Bendamustine 70 mg/m2 on days 1 and 2 every 4 weeks. Status post 3 cycles.  INTERVAL HISTORY: Jeremy Mcdaniel 79 y.o. male returns to the clinic today for followup visit accompanied by his wife. The patient is feeling fine today with no specific complaints. He denied having any significant weight loss or night sweats. He denied having any chest pain, shortness breath, cough or hemoptysis. He has no palpable lymphadenopathy. He tolerated the previous 3 cycles of systemic chemotherapy with Rituxan and bendamustine fairly well. He is here today with repeat CBC for evaluation of his disease.  MEDICAL HISTORY: Past Medical History  Diagnosis Date  . Hypertension   . Hypercholesterolemia   . Hernia   . Diabetes mellitus   . Hypercholesterolemia   . Leg cramps     at night     ALLERGIES:  is allergic to anesthetics, amide; azulfidine; codeine; penicillins; and sulfa antibiotics.  MEDICATIONS:  Current Outpatient Prescriptions  Medication Sig Dispense Refill  . aspirin EC 81 MG tablet Take 81 mg by mouth daily.       . Cinnamon 500 MG TABS Take 1 tablet by mouth every morning.      Marland Kitchen lisinopril (PRINIVIL,ZESTRIL) 5 MG tablet Take 10 mg by mouth daily.       . simvastatin (ZOCOR) 20 MG tablet Take 10 mg by mouth every evening.      . prochlorperazine (COMPAZINE) 10 MG tablet Take 1 tablet (10 mg total) by mouth every 6 (six) hours as needed.  60 tablet  0   No current facility-administered medications for this visit.    SURGICAL HISTORY:  Past Surgical History  Procedure Laterality Date  . Hemorrhoid surgery     . Hernia repair      laparoscopic ventral -[facility] 3 [facility]. Total of 4 hernia surgeries  . Cataract extraction w/phaco  05/16/2012    Procedure: CATARACT EXTRACTION PHACO AND INTRAOCULAR LENS PLACEMENT (IOC);  Surgeon: Gemma Payor, MD;  Location: [facility];  Service: Ophthalmology;  Laterality: Left;  CDE:17.23  . Cataract extraction w/phaco  07/14/2012    Procedure: CATARACT EXTRACTION PHACO AND INTRAOCULAR LENS PLACEMENT (IOC);  Surgeon: Gemma Payor, MD;  Location: [facility];  Service: Ophthalmology;  Laterality: Right;  CDE=15.85    REVIEW OF SYSTEMS:  A comprehensive review of systems was negative.   PHYSICAL EXAMINATION: General appearance: alert, cooperative and no distress Head: Normocephalic, without obvious abnormality, atraumatic Neck: no adenopathy Lymph nodes: Cervical, supraclavicular, and axillary nodes normal. Resp: clear to auscultation bilaterally Cardio: regular rate and rhythm, S1, S2 normal, no murmur, click, rub or gallop GI: soft, non-tender; bowel sounds normal; no masses,  no organomegaly Extremities: extremities normal, atraumatic, no cyanosis or edema  ECOG PERFORMANCE STATUS: 1 - Symptomatic but completely ambulatory  Blood pressure 150/77, pulse 69, temperature 96.9 F (36.1 C), temperature source Oral, resp. rate 18, height 5\' 6"  (1.676 m), weight 190 lb 11.2 oz (86.501 kg).  LABORATORY DATA: Lab Results  Component Value Date   WBC 4.2 02/23/2013   HGB 13.8 02/23/2013   HCT 39.9 02/23/2013   MCV 86.7 02/23/2013   PLT  126* 02/23/2013      Chemistry      Component Value Date/Time   NA 143 01/24/2013 0810   NA 145 05/05/2012 1340   K 4.1 01/24/2013 0810   K 4.3 05/05/2012 1340   CL 111* 01/24/2013 0810   CL 106 05/05/2012 1340   CO2 24 01/24/2013 0810   CO2 27 05/05/2012 1340   BUN 17.0 01/24/2013 0810   BUN 29* 05/05/2012 1340   CREATININE 1.5* 01/24/2013 0810   CREATININE 1.64* 05/05/2012 1340      Component Value Date/Time   CALCIUM 9.1 01/24/2013 0810   CALCIUM 9.8  05/05/2012 1340   ALKPHOS 71 01/24/2013 0810   ALKPHOS 54 02/03/2010 1945   AST 20 01/24/2013 0810   AST 19 02/03/2010 1945   ALT 16 01/24/2013 0810   ALT 14 02/03/2010 1945   BILITOT 0.51 01/24/2013 0810   BILITOT 0.7 02/03/2010 1945       RADIOGRAPHIC STUDIES: No results found.  ASSESSMENT: this is a very pleasant 79 years old white male with history of progressive chronic lymphocytic leukemia status post 3 cycles of systemic chemotherapy with Rituxan and bendamustine with significant improvement in his disease. His CBC is better today.   PLAN:  I discussed the lab result with the patient and his family. I recommended for him to continue on observation with repeat CBC, comprehensive metabolic panel and LDH in 3 months. He was advised to call immediately if he has any concerning symptoms in the interval.  All questions were answered. The patient knows to call the clinic with any problems, questions or concerns. We can certainly see the patient much sooner if necessary.

## 2013-02-23 NOTE — Patient Instructions (Signed)
Your CBC is good today. Follow up visit in 3 months with repeat CBC, comprehensive metabolic panel and LDH

## 2013-02-23 NOTE — Telephone Encounter (Signed)
GV AND PRINTED APPT SCHED AND AVS FORPT... °

## 2013-05-22 ENCOUNTER — Telehealth: Payer: Self-pay | Admitting: Internal Medicine

## 2013-05-22 ENCOUNTER — Ambulatory Visit (HOSPITAL_BASED_OUTPATIENT_CLINIC_OR_DEPARTMENT_OTHER): Payer: Medicare Other | Admitting: Internal Medicine

## 2013-05-22 ENCOUNTER — Other Ambulatory Visit (HOSPITAL_BASED_OUTPATIENT_CLINIC_OR_DEPARTMENT_OTHER): Payer: Medicare Other | Admitting: Lab

## 2013-05-22 ENCOUNTER — Encounter: Payer: Self-pay | Admitting: Internal Medicine

## 2013-05-22 VITALS — BP 145/72 | HR 61 | Temp 97.1°F | Resp 20 | Ht 66.0 in | Wt 189.1 lb

## 2013-05-22 DIAGNOSIS — C9110 Chronic lymphocytic leukemia of B-cell type not having achieved remission: Secondary | ICD-10-CM

## 2013-05-22 LAB — COMPREHENSIVE METABOLIC PANEL (CC13)
Albumin: 3.9 g/dL (ref 3.5–5.0)
Alkaline Phosphatase: 75 U/L (ref 40–150)
BUN: 17.5 mg/dL (ref 7.0–26.0)
CO2: 25 mEq/L (ref 22–29)
Glucose: 103 mg/dl (ref 70–140)
Sodium: 142 mEq/L (ref 136–145)
Total Bilirubin: 0.44 mg/dL (ref 0.20–1.20)
Total Protein: 6.5 g/dL (ref 6.4–8.3)

## 2013-05-22 LAB — CBC WITH DIFFERENTIAL/PLATELET
Basophils Absolute: 0 10*3/uL (ref 0.0–0.1)
EOS%: 1.4 % (ref 0.0–7.0)
Eosinophils Absolute: 0.1 10*3/uL (ref 0.0–0.5)
HCT: 41.4 % (ref 38.4–49.9)
HGB: 13.8 g/dL (ref 13.0–17.1)
MCH: 27.1 pg — ABNORMAL LOW (ref 27.2–33.4)
MCV: 81.2 fL (ref 79.3–98.0)
MONO%: 12.1 % (ref 0.0–14.0)
NEUT#: 2 10*3/uL (ref 1.5–6.5)
NEUT%: 55.2 % (ref 39.0–75.0)
Platelets: 124 10*3/uL — ABNORMAL LOW (ref 140–400)

## 2013-05-22 NOTE — Progress Notes (Signed)
[facility] Telephone:(336) [phone]   Fax:(336) [phone]  OFFICE PROGRESS NOTE  Ralene Ok, MD [address] [place] [address]  DIAGNOSIS: Progressive chronic lymphocytic leukemia, diagnosed more than 30 years ago but now with short doubling time.   PRIOR THERAPY: Systemic chemotherapy with Rituxan 375 mg/M2 on day 1 and Bendamustine 70 mg/m2 on days 1 and 2 every 4 weeks. Status post 3 cycles. Last dose was given 01/24/2013 with significant improvement in his disease.   CURRENT THERAPY: Observation.  INTERVAL HISTORY: Jeremy Mcdaniel 79 y.o. male returns to the clinic today for follow up visit accompanied his wife. The patient is feeling fine today with no specific complaints. He denied having any significant weight loss or night sweats. He denied having any chest pain, shortness breath, cough or hemoptysis. He has no palpable lymphadenopathy. 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 He has repeat CBC performed earlier today and he is here for evaluation and discussion of his lab results.  MEDICAL HISTORY: Past Medical History  Diagnosis Date  . Hypertension   . Hypercholesterolemia   . Hernia   . Diabetes mellitus   . Hypercholesterolemia   . Leg cramps     at night     ALLERGIES:  is allergic to anesthetics, amide; azulfidine; codeine; penicillins; and sulfa antibiotics.  MEDICATIONS:  Current Outpatient Prescriptions  Medication Sig Dispense Refill  . aspirin EC 81 MG tablet Take 81 mg by mouth daily.       . Cinnamon 500 MG TABS Take 2 tablets by mouth every morning.       Marland Kitchen lisinopril (PRINIVIL,ZESTRIL) 5 MG tablet Take 10 mg by mouth daily.       . simvastatin (ZOCOR) 20 MG tablet Take 10 mg by mouth every evening.       No current facility-administered medications for this visit.    SURGICAL HISTORY:  Past Surgical History  Procedure Laterality Date  . Hemorrhoid surgery    . Hernia repair     laparoscopic ventral -had 3 [facility]. Total of 4 hernia surgeries  . Cataract extraction w/phaco  05/16/2012    Procedure: CATARACT EXTRACTION PHACO AND INTRAOCULAR LENS PLACEMENT (IOC);  Surgeon: Gemma Payor, MD;  Location: [facility];  Service: Ophthalmology;  Laterality: Left;  CDE:17.23  . Cataract extraction w/phaco  07/14/2012    Procedure: CATARACT EXTRACTION PHACO AND INTRAOCULAR LENS PLACEMENT (IOC);  Surgeon: Gemma Payor, MD;  Location: [facility];  Service: Ophthalmology;  Laterality: Right;  CDE=15.85    REVIEW OF SYSTEMS:  A comprehensive review of systems was negative.   PHYSICAL EXAMINATION: General appearance: alert, cooperative and no distress Head: Normocephalic, without obvious abnormality, atraumatic Neck: no adenopathy Lymph nodes: Cervical, supraclavicular, and axillary nodes normal. Resp: clear to auscultation bilaterally Cardio: regular rate and rhythm, S1, S2 normal, no murmur, click, rub or gallop GI: soft, non-tender; bowel sounds normal; no masses,  no organomegaly Extremities: extremities normal, atraumatic, no cyanosis or edema  ECOG PERFORMANCE STATUS: 1 - Symptomatic but completely ambulatory  Blood pressure 145/72, pulse 61, temperature 97.1 F (36.2 C), temperature source Oral, resp. rate 20, height 5\' 6"  (1.676 m), weight 189 lb 1.6 oz (85.775 kg).  LABORATORY DATA: Lab Results  Component Value Date   WBC 3.7* 05/22/2013   HGB 13.8 05/22/2013   HCT 41.4 05/22/2013   MCV 81.2 05/22/2013   PLT 124* 05/22/2013      Chemistry  Component Value Date/Time   NA 142 05/22/2013 1346   NA 145 05/05/2012 1340   K 4.4 05/22/2013 1346   K 4.3 05/05/2012 1340   CL 106 02/23/2013 0957   CL 106 05/05/2012 1340   CO2 25 05/22/2013 1346   CO2 27 05/05/2012 1340   BUN 17.5 05/22/2013 1346   BUN 29* 05/05/2012 1340   CREATININE 1.3 05/22/2013 1346   CREATININE 1.64* 05/05/2012 1340      Component Value Date/Time   CALCIUM 9.5 05/22/2013 1346   CALCIUM 9.8 05/05/2012 1340   ALKPHOS  75 05/22/2013 1346   ALKPHOS 54 02/03/2010 1945   AST 23 05/22/2013 1346   AST 19 02/03/2010 1945   ALT 15 05/22/2013 1346   ALT 14 02/03/2010 1945   BILITOT 0.44 05/22/2013 1346   BILITOT 0.7 02/03/2010 1945       RADIOGRAPHIC STUDIES: No results found.  ASSESSMENT AND PLAN:  This is a very pleasant 79 years old white male with progressive chronic lymphocytic leukemia status post systemic chemotherapy with Rituxan and Bendamustine. He has significant improvement in his disease and he is currently on observation with no evidence for disease progression. I discussed the lab result with the patient and his wife. I recommended for him to continue on observation with repeat CBC, comprehensive metabolic panel and LDH in 6 months. The patient would come back for follow up visit at that time.  All questions were answered. The patient knows to call the clinic with any problems, questions or concerns. We can certainly see the patient much sooner if necessary.

## 2013-05-22 NOTE — Telephone Encounter (Signed)
Gave pt appt for lab and Md on January 2014

## 2013-05-22 NOTE — Patient Instructions (Signed)
Continue on observation with repeat CBC in six-month

## 2013-07-17 ENCOUNTER — Encounter: Payer: Self-pay | Admitting: Internal Medicine

## 2013-11-21 ENCOUNTER — Ambulatory Visit (HOSPITAL_BASED_OUTPATIENT_CLINIC_OR_DEPARTMENT_OTHER): Payer: Medicare Other | Admitting: Internal Medicine

## 2013-11-21 ENCOUNTER — Encounter: Payer: Self-pay | Admitting: Internal Medicine

## 2013-11-21 ENCOUNTER — Other Ambulatory Visit (HOSPITAL_BASED_OUTPATIENT_CLINIC_OR_DEPARTMENT_OTHER): Payer: Medicare Other

## 2013-11-21 ENCOUNTER — Telehealth: Payer: Self-pay | Admitting: Internal Medicine

## 2013-11-21 VITALS — BP 161/60 | HR 68 | Temp 98.5°F | Resp 18 | Ht 66.0 in | Wt 195.8 lb

## 2013-11-21 DIAGNOSIS — I10 Essential (primary) hypertension: Secondary | ICD-10-CM

## 2013-11-21 DIAGNOSIS — C9110 Chronic lymphocytic leukemia of B-cell type not having achieved remission: Secondary | ICD-10-CM

## 2013-11-21 DIAGNOSIS — E119 Type 2 diabetes mellitus without complications: Secondary | ICD-10-CM

## 2013-11-21 LAB — CBC WITH DIFFERENTIAL/PLATELET
BASO%: 0.7 % (ref 0.0–2.0)
Basophils Absolute: 0 10*3/uL (ref 0.0–0.1)
EOS%: 1.1 % (ref 0.0–7.0)
Eosinophils Absolute: 0.1 10*3/uL (ref 0.0–0.5)
HEMATOCRIT: 41.6 % (ref 38.4–49.9)
HGB: 14.2 g/dL (ref 13.0–17.1)
LYMPH#: 1.3 10*3/uL (ref 0.9–3.3)
LYMPH%: 21.3 % (ref 14.0–49.0)
MCH: 29.3 pg (ref 27.2–33.4)
MCHC: 34.1 g/dL (ref 32.0–36.0)
MCV: 86 fL (ref 79.3–98.0)
MONO#: 0.4 10*3/uL (ref 0.1–0.9)
MONO%: 6.7 % (ref 0.0–14.0)
NEUT#: 4.2 10*3/uL (ref 1.5–6.5)
NEUT%: 70.2 % (ref 39.0–75.0)
PLATELETS: 161 10*3/uL (ref 140–400)
RBC: 4.83 10*6/uL (ref 4.20–5.82)
RDW: 15.7 % — ABNORMAL HIGH (ref 11.0–14.6)
WBC: 6 10*3/uL (ref 4.0–10.3)

## 2013-11-21 LAB — COMPREHENSIVE METABOLIC PANEL (CC13)
ALK PHOS: 85 U/L (ref 40–150)
ALT: 17 U/L (ref 0–55)
AST: 20 U/L (ref 5–34)
Albumin: 3.8 g/dL (ref 3.5–5.0)
Anion Gap: 13 mEq/L — ABNORMAL HIGH (ref 3–11)
BILIRUBIN TOTAL: 0.43 mg/dL (ref 0.20–1.20)
BUN: 23.3 mg/dL (ref 7.0–26.0)
CHLORIDE: 105 meq/L (ref 98–109)
CO2: 26 mEq/L (ref 22–29)
CREATININE: 1.4 mg/dL — AB (ref 0.7–1.3)
Calcium: 9.2 mg/dL (ref 8.4–10.4)
Glucose: 181 mg/dl — ABNORMAL HIGH (ref 70–140)
Potassium: 4.2 mEq/L (ref 3.5–5.1)
Sodium: 143 mEq/L (ref 136–145)
Total Protein: 6.5 g/dL (ref 6.4–8.3)

## 2013-11-21 LAB — LACTATE DEHYDROGENASE (CC13): LDH: 189 U/L (ref 125–245)

## 2013-11-21 NOTE — Patient Instructions (Signed)
Followup visit in 6 months with repeat blood work.

## 2013-11-21 NOTE — Progress Notes (Signed)
[place] [phone]   Fax:(336) [place], MD [address] [place] [address]  DIAGNOSIS: Progressive chronic lymphocytic leukemia, diagnosed more than 30 years ago but now with short doubling time.   PRIOR THERAPY: Systemic chemotherapy with Rituxan 375 mg/M2 on day 1 and Bendamustine 70 mg/m2 on days 1 and 2 every 4 weeks. Status post 3 cycles. Last dose was given 01/24/2013 with significant improvement in his disease.   CURRENT THERAPY: Observation.  INTERVAL HISTORY: Jeremy Mcdaniel 80 y.o. male returns to the clinic today for follow up visit accompanied his wife. The patient is feeling fine today with no specific complaints. He denied having any significant weight loss or night sweats. He denied having any chest pain, shortness breath, cough or hemoptysis. He has no palpable lymphadenopathy. He stays very active all the time. He has repeat CBC performed earlier today and he is here for evaluation and discussion of his lab results.  MEDICAL HISTORY: Past Medical History  Diagnosis Date  . Hypertension   . Hypercholesterolemia   . Hernia   . Diabetes mellitus   . Hypercholesterolemia   . Leg cramps     at night     ALLERGIES:  is allergic to anesthetics, amide; azulfidine; codeine; penicillins; and sulfa antibiotics.  MEDICATIONS:  Current Outpatient Prescriptions  Medication Sig Dispense Refill  . aspirin EC 81 MG tablet Take 81 mg by mouth daily.       . Cinnamon 500 MG TABS Take 2 tablets by mouth every morning.       Marland Kitchen lisinopril (PRINIVIL,ZESTRIL) 5 MG tablet Take 10 mg by mouth daily.       . simvastatin (ZOCOR) 20 MG tablet Take 10 mg by mouth every evening.       No current facility-administered medications for this visit.    SURGICAL HISTORY:  Past Surgical History  Procedure Laterality Date  . Hemorrhoid surgery    . Hernia repair      laparoscopic ventral -[facility] 3 [facility]. Total  of 4 hernia surgeries  . Cataract extraction w/phaco  05/16/2012    Procedure: CATARACT EXTRACTION PHACO AND INTRAOCULAR LENS PLACEMENT (IOC);  Surgeon: Tonny Branch, MD;  Location: [facility];  Service: Ophthalmology;  Laterality: Left;  CDE:17.23  . Cataract extraction w/phaco  07/14/2012    Procedure: CATARACT EXTRACTION PHACO AND INTRAOCULAR LENS PLACEMENT (IOC);  Surgeon: Tonny Branch, MD;  Location: [facility];  Service: Ophthalmology;  Laterality: Right;  CDE=15.85    REVIEW OF SYSTEMS:  A comprehensive review of systems was negative.   PHYSICAL EXAMINATION: General appearance: alert, cooperative and no distress Head: Normocephalic, without obvious abnormality, atraumatic Neck: no adenopathy Lymph nodes: Cervical, supraclavicular, and axillary nodes normal. Resp: clear to auscultation bilaterally Cardio: regular rate and rhythm, S1, S2 normal, no murmur, click, rub or gallop GI: soft, non-tender; bowel sounds normal; no masses,  no organomegaly Extremities: extremities normal, atraumatic, no cyanosis or edema  ECOG PERFORMANCE STATUS: 1 - Symptomatic but completely ambulatory  Blood pressure 161/60, pulse 68, temperature 98.5 F (36.9 C), temperature source Oral, resp. rate 18, height 5\' 6"  (1.676 m), weight 195 lb 12.8 oz (88.814 kg).  LABORATORY DATA: Lab Results  Component Value Date   WBC 6.0 11/21/2013   HGB 14.2 11/21/2013   HCT 41.6 11/21/2013   MCV 86.0 11/21/2013   PLT 161 11/21/2013      Chemistry      Component Value  Date/Time   NA 142 05/22/2013 1346   NA 145 05/05/2012 1340   K 4.4 05/22/2013 1346   K 4.3 05/05/2012 1340   CL 106 02/23/2013 0957   CL 106 05/05/2012 1340   CO2 25 05/22/2013 1346   CO2 27 05/05/2012 1340   BUN 17.5 05/22/2013 1346   BUN 29* 05/05/2012 1340   CREATININE 1.3 05/22/2013 1346   CREATININE 1.64* 05/05/2012 1340      Component Value Date/Time   CALCIUM 9.5 05/22/2013 1346   CALCIUM 9.8 05/05/2012 1340   ALKPHOS 75 05/22/2013 1346   ALKPHOS 54 02/03/2010  1945   AST 23 05/22/2013 1346   AST 19 02/03/2010 1945   ALT 15 05/22/2013 1346   ALT 14 02/03/2010 1945   BILITOT 0.44 05/22/2013 1346   BILITOT 0.7 02/03/2010 1945       RADIOGRAPHIC STUDIES: No results found.  ASSESSMENT AND PLAN:  This is a very pleasant 80 years old white male with progressive chronic lymphocytic leukemia status post systemic chemotherapy with Rituxan and Bendamustine. He has significant improvement in his disease and he is currently on observation with no evidence for disease progression. I discussed the lab result with the patient and his wife. I recommended for him to continue on observation with repeat CBC, comprehensive metabolic panel and LDH in 6 months. The patient would come back for follow up visit at that time.  All questions were answered. The patient knows to call the clinic with any problems, questions or concerns. We can certainly see the patient much sooner if necessary.  Disclaimer: This note was dictated with voice recognition software. Similar sounding words can inadvertently be transcribed and may not be corrected upon review.

## 2013-11-21 NOTE — Telephone Encounter (Signed)
Gave pt appt for lab and Md on july 2015

## 2014-04-08 ENCOUNTER — Emergency Department (HOSPITAL_COMMUNITY): Payer: Medicare Other

## 2014-04-08 ENCOUNTER — Emergency Department (HOSPITAL_COMMUNITY)
Admission: EM | Admit: 2014-04-08 | Discharge: 2014-04-08 | Disposition: A | Discharge status: Home / Self Care | Payer: Medicare Other | Attending: Emergency Medicine | Admitting: Emergency Medicine

## 2014-04-08 ENCOUNTER — Encounter (HOSPITAL_COMMUNITY): Payer: Self-pay | Admitting: Emergency Medicine

## 2014-04-08 DIAGNOSIS — Z7982 Long term (current) use of aspirin: Secondary | ICD-10-CM | POA: Insufficient documentation

## 2014-04-08 DIAGNOSIS — I10 Essential (primary) hypertension: Secondary | ICD-10-CM | POA: Insufficient documentation

## 2014-04-08 DIAGNOSIS — Z856 Personal history of leukemia: Secondary | ICD-10-CM | POA: Insufficient documentation

## 2014-04-08 DIAGNOSIS — E7800 Pure hypercholesterolemia, unspecified: Secondary | ICD-10-CM | POA: Insufficient documentation

## 2014-04-08 DIAGNOSIS — E119 Type 2 diabetes mellitus without complications: Secondary | ICD-10-CM | POA: Insufficient documentation

## 2014-04-08 DIAGNOSIS — Z8719 Personal history of other diseases of the digestive system: Secondary | ICD-10-CM | POA: Insufficient documentation

## 2014-04-08 DIAGNOSIS — M25559 Pain in unspecified hip: Secondary | ICD-10-CM | POA: Insufficient documentation

## 2014-04-08 DIAGNOSIS — Z79899 Other long term (current) drug therapy: Secondary | ICD-10-CM | POA: Insufficient documentation

## 2014-04-08 DIAGNOSIS — Z880 Allergy status to penicillin: Secondary | ICD-10-CM | POA: Insufficient documentation

## 2014-04-08 DIAGNOSIS — M25551 Pain in right hip: Secondary | ICD-10-CM

## 2014-04-08 MED ORDER — HYDROMORPHONE HCL 2 MG PO TABS: 2.0000 mg | ORAL_TABLET | Freq: Four times a day (QID) | ORAL | Status: DC | PRN

## 2014-04-08 MED ORDER — HYDROMORPHONE HCL 2 MG PO TABS
2.0000 mg | ORAL_TABLET | Freq: Once | ORAL | Status: AC
  Administered 2014-04-08: 2 mg via ORAL
  Filled 2014-04-08: qty 1

## 2014-04-08 NOTE — ED Notes (Signed)
Pt with R hip pain that sometimes radiates to knees. States his medicine he takes for it is not helping.

## 2014-04-08 NOTE — ED Notes (Signed)
P[t c/o right hip pain that radiates down right leg  for the past several months that became worse yesterday, denies any new injury, states that he has been seen by his pcp for the hip pain, had tried advil yesterday with no improvement in symptoms, cms intact distal

## 2014-04-08 NOTE — ED Provider Notes (Addendum)
CSN: [id number]     Arrival date & time 04/08/14  1342 History  This chart was scribed for Fredia Sorrow, MD by Randa Evens, ED Scribe. This patient was seen in room [facility] and the patient's care was started at 4:47 PM.    Chief Complaint  Patient presents with  . Hip Pain   Patient is a 80 y.o. male presenting with hip pain. The history is provided by the patient. No language interpreter was used.  Hip Pain The current episode started more than 1 week ago. The problem occurs constantly. The problem has been gradually worsening. Pertinent negatives include no chest pain, no abdominal pain, no headaches and no shortness of breath. The symptoms are aggravated by walking. The symptoms are relieved by rest. He has tried acetaminophen for the symptoms.   HPI Comments: Jeremy Mcdaniel is a 80 y.o. male who presents to the Emergency Department complaining of right sided hip pain onset several months prior. He states that the pain radates down his right leg. He states that the hip pain severity is 8/10 when active.He states the pain is currently 3/10.  He also states the pain is sharp. Associate ankle edema, and bruising easily. Pt states that yesterday the pain level worsened. He states that he has been taking Advil with improvements to his symptoms. He states that he took a prescription pain medicine also that he received 2 years prior for a previous injury with no improvement to his symptoms. He denies back pain, fever, visual distabance, cough, chest pain, sob, abdominal pain, nausea, vomitting, diarrhea, dysuria, rash, neck pain, headache,    PCP Dr. Mellody Drown  Past Medical History  Diagnosis Date  . Hypertension   . Hypercholesterolemia   . Hernia   . Diabetes mellitus   . Hypercholesterolemia   . Leg cramps     at night   . Leukemia    Past Surgical History  Procedure Laterality Date  . Hemorrhoid surgery    . Hernia repair      laparoscopic ventral -[facility] 3 [facility]. Total of 4  hernia surgeries  . Cataract extraction w/phaco  05/16/2012    Procedure: CATARACT EXTRACTION PHACO AND INTRAOCULAR LENS PLACEMENT (IOC);  Surgeon: Tonny Branch, MD;  Location: [facility];  Service: Ophthalmology;  Laterality: Left;  CDE:17.23  . Cataract extraction w/phaco  07/14/2012    Procedure: CATARACT EXTRACTION PHACO AND INTRAOCULAR LENS PLACEMENT (IOC);  Surgeon: Tonny Branch, MD;  Location: [facility];  Service: Ophthalmology;  Laterality: Right;  CDE=15.85   No family history on file. History  Substance Use Topics  . Smoking status: Never Smoker   . Smokeless tobacco: Not on file  . Alcohol Use: No    Review of Systems  Constitutional: Negative for fever.  HENT: Negative for rhinorrhea.   Eyes: Negative for visual disturbance.  Respiratory: Negative for cough and shortness of breath.   Cardiovascular: Negative for chest pain.  Gastrointestinal: Negative for vomiting, abdominal pain and diarrhea.  Genitourinary: Negative for dysuria.  Musculoskeletal: Negative for back pain.  Skin: Negative for rash.       Pt states he bruises easily.   Neurological: Negative for headaches.  Hematological: Bruises/bleeds easily.  Psychiatric/Behavioral: Negative for confusion.      Allergies  Anesthetics, amide; Azulfidine; Codeine; Penicillins; and Sulfa antibiotics  Home Medications   Prior to Admission medications   Medication Sig Start Date End Date Taking? Authorizing Provider  aspirin EC 81 MG tablet Take 81 mg by mouth daily.  Historical Provider, MD  Cinnamon 500 MG TABS Take 2 tablets by mouth every morning.     Historical Provider, MD  lisinopril (PRINIVIL,ZESTRIL) 5 MG tablet Take 10 mg by mouth daily.     Historical Provider, MD  simvastatin (ZOCOR) 20 MG tablet Take 10 mg by mouth every evening.    Historical Provider, MD   Triage Vitals: BP 184/91  Pulse 68  Temp(Src) 97.9 F (36.6 C) (Oral)  Resp 18  SpO2 97%  Physical Exam  Nursing note and vitals  reviewed. Constitutional: He is oriented to person, place, and time. He appears well-developed and well-nourished.  HENT:  Head: Normocephalic and atraumatic.  Eyes: EOM are normal.  Neck: Normal range of motion.  Cardiovascular: Normal rate, regular rhythm and normal heart sounds.   No murmur heard. Pulmonary/Chest: Effort normal and breath sounds normal.  Abdominal: Soft. Bowel sounds are normal. There is no tenderness.  Musculoskeletal: Normal range of motion. He exhibits no edema.  Neurological: He is alert and oriented to person, place, and time. No cranial nerve deficit. He exhibits normal muscle tone. Coordination normal.  Skin: Skin is warm and dry.  Psychiatric: He has a normal mood and affect. His behavior is normal.    ED Course  Procedures (including critical care time) DIAGNOSTIC STUDIES: Oxygen Saturation is 97% on RA, adequate by my interpretation.    COORDINATION OF CARE: 5:00 PM-Discussed treatment plan which includes pain medication and referral to orthopedic with pt at bedside and pt agreed to plan.     Results for orders placed in visit on 11/21/13  CBC WITH DIFFERENTIAL      Result Value Ref Range   WBC 6.0  4.0 - 10.3 10e3/uL   NEUT# 4.2  1.5 - 6.5 10e3/uL   HGB 14.2  13.0 - 17.1 g/dL   HCT 41.6  38.4 - 49.9 %   Platelets 161  140 - 400 10e3/uL   MCV 86.0  79.3 - 98.0 fL   MCH 29.3  27.2 - 33.4 pg   MCHC 34.1  32.0 - 36.0 g/dL   RBC 4.83  4.20 - 5.82 10e6/uL   RDW 15.7 (*) 11.0 - 14.6 %   lymph# 1.3  0.9 - 3.3 10e3/uL   MONO# 0.4  0.1 - 0.9 10e3/uL   Eosinophils Absolute 0.1  0.0 - 0.5 10e3/uL   Basophils Absolute 0.0  0.0 - 0.1 10e3/uL   NEUT% 70.2  39.0 - 75.0 %   LYMPH% 21.3  14.0 - 49.0 %   MONO% 6.7  0.0 - 14.0 %   EOS% 1.1  0.0 - 7.0 %   BASO% 0.7  0.0 - 2.0 %  COMPREHENSIVE METABOLIC PANEL (VZ56)      Result Value Ref Range   Sodium 143  136 - 145 mEq/L   Potassium 4.2  3.5 - 5.1 mEq/L   Chloride 105  98 - 109 mEq/L   CO2 26  22 - 29  mEq/L   Glucose 181 (*) 70 - 140 mg/dl   BUN 23.3  7.0 - 26.0 mg/dL   Creatinine 1.4 (*) 0.7 - 1.3 mg/dL   Total Bilirubin 0.43  0.20 - 1.20 mg/dL   Alkaline Phosphatase 85  40 - 150 U/L   AST 20  5 - 34 U/L   ALT 17  0 - 55 U/L   Total Protein 6.5  6.4 - 8.3 g/dL   Albumin 3.8  3.5 - 5.0 g/dL   Calcium 9.2  8.4 -  10.4 mg/dL   Anion Gap 13 (*) 3 - 11 mEq/L  LACTATE DEHYDROGENASE (CC13)      Result Value Ref Range   LDH 189  125 - 245 U/L   Dg Hip Complete Right  04/08/2014   CLINICAL DATA:  Right hip pain  EXAM: RIGHT HIP - COMPLETE 2+ VIEW  COMPARISON:  None.  FINDINGS: There is no evidence of hip fracture or dislocation. There is no evidence of arthropathy or other focal bone abnormality.  IMPRESSION: No acute abnormality noted.   Electronically Signed   By: Inez Catalina M.D.   On: 04/08/2014 14:45   Medications - No data to display  MDM   Final diagnoses:  Hip pain, right    Patient without any injury to his right hip x-rays negative hernia 3 changes. Will treat with pain medication referral to orthopedics for further evaluation.  I personally performed the services described in this documentation, which was scribed in my presence. The recorded information has been reviewed and is accurate.       Fredia Sorrow, MD 04/08/14 [place], MD 04/08/14 [phone]

## 2014-04-08 NOTE — Discharge Instructions (Signed)
Take pain medicine as directed. Make an appointment to followup with Dr. Aline Brochure from orthopedics. Return for any new or worse symptoms. Pain medication prescribed you can take 1 or 2 tablets every 6 hours as needed for pain. Start with just the one to see how you respond.

## 2014-04-26 ENCOUNTER — Ambulatory Visit (INDEPENDENT_AMBULATORY_CARE_PROVIDER_SITE_OTHER): Payer: Medicare Other | Admitting: Orthopedic Surgery

## 2014-04-26 VITALS — BP 161/78 | Ht 66.0 in | Wt 186.0 lb

## 2014-04-26 DIAGNOSIS — M5137 Other intervertebral disc degeneration, lumbosacral region: Secondary | ICD-10-CM | POA: Insufficient documentation

## 2014-04-26 NOTE — Progress Notes (Signed)
   Subjective:    Patient ID: Jeremy Mcdaniel, male    DOB: [birth year], 80 y.o.   MRN: [id number]  HPI Comments: AE 46-year-old male 6 weeks ago had some burning pain in his right hip radiating down his right leg went to the ER x-rays of his hip are normal. He does have some degenerative disc disease from previous x-rays noted on his lumbar spine  Is doing well now has minimal symptoms and wasn't going to come except it was insisted by his doctors that he show up for his appointment. He does have a history of leukemia so was imperative that he get evaluated.    Hip Pain  The incident occurred more than 1 week ago. There was no injury mechanism. The pain is present in the right hip and left hip. The quality of the pain is described as aching and shooting. The pain is at a severity of 1/10. The pain has been improving since onset. Associated symptoms include numbness and tingling. Pertinent negatives include no inability to bear weight, loss of motion, loss of sensation or muscle weakness.      Review of Systems  HENT: Positive for sinus pressure.   Eyes: Positive for visual disturbance.  Neurological: Positive for dizziness, tingling and numbness.  Psychiatric/Behavioral: Positive for sleep disturbance.  All other systems reviewed and are negative.      Objective:   Physical Exam  Nursing note and vitals reviewed. Constitutional: He is oriented to person, place, and time. He appears well-developed and well-nourished. No distress.  HENT:  Head: Normocephalic.  Cardiovascular: Normal rate and intact distal pulses.   Musculoskeletal: Normal range of motion.  Neurological: He is alert and oriented to person, place, and time. He has normal reflexes. He displays normal reflexes. He exhibits normal muscle tone.  Skin: Skin is warm and dry. He is not diaphoretic.  Psychiatric: He has a normal mood and affect. His behavior is normal. Judgment and thought content normal.   straight leg raises  were normal bilaterally    X-rays were reviewed hip x-rays normal previous lumbar spine films show severe degenerative disc disease    Assessment & Plan:  I think he has degenerative disease and acute radiculopathy which resolved  Follow up as needed call us if any problems

## 2014-05-21 ENCOUNTER — Other Ambulatory Visit (HOSPITAL_BASED_OUTPATIENT_CLINIC_OR_DEPARTMENT_OTHER): Payer: Medicare Other

## 2014-05-21 ENCOUNTER — Telehealth: Payer: Self-pay | Admitting: Internal Medicine

## 2014-05-21 ENCOUNTER — Ambulatory Visit (HOSPITAL_BASED_OUTPATIENT_CLINIC_OR_DEPARTMENT_OTHER): Payer: Medicare Other | Admitting: Internal Medicine

## 2014-05-21 ENCOUNTER — Encounter: Payer: Self-pay | Admitting: Internal Medicine

## 2014-05-21 VITALS — BP 145/66 | HR 60 | Temp 98.0°F | Resp 19 | Ht 66.0 in | Wt 187.1 lb

## 2014-05-21 DIAGNOSIS — C9110 Chronic lymphocytic leukemia of B-cell type not having achieved remission: Secondary | ICD-10-CM

## 2014-05-21 LAB — CBC WITH DIFFERENTIAL/PLATELET
BASO%: 0.8 % (ref 0.0–2.0)
BASOS ABS: 0.1 10*3/uL (ref 0.0–0.1)
EOS ABS: 0.1 10*3/uL (ref 0.0–0.5)
EOS%: 0.8 % (ref 0.0–7.0)
HCT: 43.6 % (ref 38.4–49.9)
HEMOGLOBIN: 14 g/dL (ref 13.0–17.1)
LYMPH%: 29.1 % (ref 14.0–49.0)
MCH: 27.3 pg (ref 27.2–33.4)
MCHC: 32.1 g/dL (ref 32.0–36.0)
MCV: 84.9 fL (ref 79.3–98.0)
MONO#: 0.7 10*3/uL (ref 0.1–0.9)
MONO%: 9 % (ref 0.0–14.0)
NEUT%: 60.3 % (ref 39.0–75.0)
NEUTROS ABS: 4.7 10*3/uL (ref 1.5–6.5)
PLATELETS: 188 10*3/uL (ref 140–400)
RBC: 5.14 10*6/uL (ref 4.20–5.82)
RDW: 15.4 % — ABNORMAL HIGH (ref 11.0–14.6)
WBC: 7.8 10*3/uL (ref 4.0–10.3)
lymph#: 2.3 10*3/uL (ref 0.9–3.3)

## 2014-05-21 LAB — COMPREHENSIVE METABOLIC PANEL (CC13)
ALBUMIN: 3.9 g/dL (ref 3.5–5.0)
ALK PHOS: 83 U/L (ref 40–150)
ALT: 15 U/L (ref 0–55)
AST: 15 U/L (ref 5–34)
Anion Gap: 10 mEq/L (ref 3–11)
BUN: 20.9 mg/dL (ref 7.0–26.0)
CO2: 26 meq/L (ref 22–29)
Calcium: 9.9 mg/dL (ref 8.4–10.4)
Chloride: 107 mEq/L (ref 98–109)
Creatinine: 1.4 mg/dL — ABNORMAL HIGH (ref 0.7–1.3)
GLUCOSE: 108 mg/dL (ref 70–140)
Potassium: 5.2 mEq/L — ABNORMAL HIGH (ref 3.5–5.1)
SODIUM: 143 meq/L (ref 136–145)
Total Bilirubin: 0.48 mg/dL (ref 0.20–1.20)
Total Protein: 6.7 g/dL (ref 6.4–8.3)

## 2014-05-21 LAB — LACTATE DEHYDROGENASE (CC13): LDH: 182 U/L (ref 125–245)

## 2014-05-21 NOTE — Progress Notes (Signed)
[place] Telephone:(336) [phone]   Fax:(336) [phone]  OFFICE PROGRESS NOTE  Jilda Panda, MD [address] [place] [address]  DIAGNOSIS: Progressive chronic lymphocytic leukemia, diagnosed more than 30 years ago but now with short doubling time.   PRIOR THERAPY: Systemic chemotherapy with Rituxan 375 mg/M2 on day 1 and Bendamustine 70 mg/m2 on days 1 and 2 every 4 weeks. Status post 3 cycles. Last dose was given 01/24/2013 with significant improvement in his disease.   CURRENT THERAPY: Observation.  INTERVAL HISTORY: Jeremy Mcdaniel 80 y.o. male returns to the clinic today for follow up visit accompanied his wife and another family member. The patient is feeling fine today with no specific complaints. He denied having any significant weight loss or night sweats. He denied having any chest pain, shortness breath, cough or hemoptysis. He has no palpable lymphadenopathy. He stays very active all the time. He has repeat CBC performed earlier today and he is here for evaluation and discussion of his lab results.  MEDICAL HISTORY: Past Medical History  Diagnosis Date  . Hypertension   . Hypercholesterolemia   . Hernia   . Diabetes mellitus   . Hypercholesterolemia   . Leg cramps     at night   . Leukemia     ALLERGIES:  is allergic to anesthetics, amide; azulfidine; codeine; penicillins; and sulfa antibiotics.  MEDICATIONS:  Current Outpatient Prescriptions  Medication Sig Dispense Refill  . aspirin EC 81 MG tablet Take 81 mg by mouth daily.       . Cinnamon 500 MG TABS Take 2 tablets by mouth every morning.       Marland Kitchen ibuprofen (ADVIL,MOTRIN) 100 MG chewable tablet Chew by mouth every 8 (eight) hours as needed.      Marland Kitchen lisinopril (PRINIVIL,ZESTRIL) 5 MG tablet Take 10 mg by mouth daily.       . simvastatin (ZOCOR) 20 MG tablet Take 10 mg by mouth every evening.       No current facility-administered medications for this visit.    SURGICAL HISTORY:    Past Surgical History  Procedure Laterality Date  . Hemorrhoid surgery    . Hernia repair      laparoscopic ventral -[facility] 3 [facility]. Total of 4 hernia surgeries  . Cataract extraction w/phaco  05/16/2012    Procedure: CATARACT EXTRACTION PHACO AND INTRAOCULAR LENS PLACEMENT (IOC);  Surgeon: Tonny Branch, MD;  Location: [facility];  Service: Ophthalmology;  Laterality: Left;  CDE:17.23  . Cataract extraction w/phaco  07/14/2012    Procedure: CATARACT EXTRACTION PHACO AND INTRAOCULAR LENS PLACEMENT (IOC);  Surgeon: Tonny Branch, MD;  Location: [facility];  Service: Ophthalmology;  Laterality: Right;  CDE=15.85    REVIEW OF SYSTEMS:  A comprehensive review of systems was negative.   PHYSICAL EXAMINATION: General appearance: alert, cooperative and no distress Head: Normocephalic, without obvious abnormality, atraumatic Neck: no adenopathy Lymph nodes: Cervical, supraclavicular, and axillary nodes normal. Resp: clear to auscultation bilaterally Cardio: regular rate and rhythm, S1, S2 normal, no murmur, click, rub or gallop GI: soft, non-tender; bowel sounds normal; no masses,  no organomegaly Extremities: extremities normal, atraumatic, no cyanosis or edema  ECOG PERFORMANCE STATUS: 1 - Symptomatic but completely ambulatory  Blood pressure 145/66, pulse 60, temperature 98 F (36.7 C), temperature source Oral, resp. rate 19, height 5\' 6"  (1.676 m), weight 187 lb 1.6 oz (84.868 kg).  LABORATORY DATA: Lab Results  Component Value Date   WBC 7.8 05/21/2014   HGB  14.0 05/21/2014   HCT 43.6 05/21/2014   MCV 84.9 05/21/2014   PLT 188 05/21/2014      Chemistry      Component Value Date/Time   NA 143 05/21/2014 1236   NA 145 05/05/2012 1340   K 5.2* 05/21/2014 1236   K 4.3 05/05/2012 1340   CL 106 02/23/2013 0957   CL 106 05/05/2012 1340   CO2 26 05/21/2014 1236   CO2 27 05/05/2012 1340   BUN 20.9 05/21/2014 1236   BUN 29* 05/05/2012 1340   CREATININE 1.4* 05/21/2014 1236   CREATININE 1.64* 05/05/2012 1340       Component Value Date/Time   CALCIUM 9.9 05/21/2014 1236   CALCIUM 9.8 05/05/2012 1340   ALKPHOS 83 05/21/2014 1236   ALKPHOS 54 02/03/2010 1945   AST 15 05/21/2014 1236   AST 19 02/03/2010 1945   ALT 15 05/21/2014 1236   ALT 14 02/03/2010 1945   BILITOT 0.48 05/21/2014 1236   BILITOT 0.7 02/03/2010 1945       RADIOGRAPHIC STUDIES: No results found.  ASSESSMENT AND PLAN:  This is a very pleasant 80 years old white male with progressive chronic lymphocytic leukemia status post systemic chemotherapy with Rituxan and Bendamustine. He has significant improvement in his disease and he is currently on observation with no evidence for disease progression. I discussed the lab result with the patient and his wife. I recommended for him to continue on observation with repeat CBC, comprehensive metabolic panel and LDH in 6 months.  All questions were answered. The patient knows to call the clinic with any problems, questions or concerns. We can certainly see the patient much sooner if necessary.  Disclaimer: This note was dictated with voice recognition software. Similar sounding words can inadvertently be transcribed and may not be corrected upon review.

## 2014-05-21 NOTE — Telephone Encounter (Signed)
gv adn rpinted appt sched and avs for pt for Jan 2016...Marland KitchenMarland Kitchen

## 2014-10-20 ENCOUNTER — Encounter (HOSPITAL_COMMUNITY): Payer: Self-pay | Admitting: Emergency Medicine

## 2014-10-20 ENCOUNTER — Observation Stay (HOSPITAL_COMMUNITY)
Admission: EM | Admit: 2014-10-20 | Discharge: 2014-10-21 | Disposition: A | Discharge status: Home / Self Care | Payer: Medicare Other | Attending: Internal Medicine | Admitting: Internal Medicine

## 2014-10-20 ENCOUNTER — Emergency Department (HOSPITAL_COMMUNITY): Payer: Medicare Other

## 2014-10-20 DIAGNOSIS — E780 Pure hypercholesterolemia: Secondary | ICD-10-CM | POA: Insufficient documentation

## 2014-10-20 DIAGNOSIS — E119 Type 2 diabetes mellitus without complications: Secondary | ICD-10-CM

## 2014-10-20 DIAGNOSIS — K469 Unspecified abdominal hernia without obstruction or gangrene: Secondary | ICD-10-CM | POA: Insufficient documentation

## 2014-10-20 DIAGNOSIS — Z7982 Long term (current) use of aspirin: Secondary | ICD-10-CM | POA: Insufficient documentation

## 2014-10-20 DIAGNOSIS — R410 Disorientation, unspecified: Secondary | ICD-10-CM | POA: Insufficient documentation

## 2014-10-20 DIAGNOSIS — I5189 Other ill-defined heart diseases: Secondary | ICD-10-CM | POA: Diagnosis present

## 2014-10-20 DIAGNOSIS — C9110 Chronic lymphocytic leukemia of B-cell type not having achieved remission: Secondary | ICD-10-CM

## 2014-10-20 DIAGNOSIS — I10 Essential (primary) hypertension: Secondary | ICD-10-CM | POA: Diagnosis not present

## 2014-10-20 DIAGNOSIS — Z880 Allergy status to penicillin: Secondary | ICD-10-CM | POA: Insufficient documentation

## 2014-10-20 DIAGNOSIS — C9590 Leukemia, unspecified not having achieved remission: Secondary | ICD-10-CM | POA: Insufficient documentation

## 2014-10-20 DIAGNOSIS — G459 Transient cerebral ischemic attack, unspecified: Secondary | ICD-10-CM | POA: Diagnosis present

## 2014-10-20 DIAGNOSIS — R413 Other amnesia: Secondary | ICD-10-CM | POA: Insufficient documentation

## 2014-10-20 DIAGNOSIS — Z79899 Other long term (current) drug therapy: Secondary | ICD-10-CM | POA: Insufficient documentation

## 2014-10-20 DIAGNOSIS — R29810 Facial weakness: Secondary | ICD-10-CM | POA: Diagnosis not present

## 2014-10-20 DIAGNOSIS — M5137 Other intervertebral disc degeneration, lumbosacral region: Secondary | ICD-10-CM

## 2014-10-20 HISTORY — DX: Chronic lymphocytic leukemia of B-cell type not having achieved remission: C91.10

## 2014-10-20 HISTORY — DX: Other ill-defined heart diseases: I51.89

## 2014-10-20 HISTORY — DX: Type 2 diabetes mellitus without complications: E11.9

## 2014-10-20 HISTORY — DX: Transient cerebral ischemic attack, unspecified: G45.9

## 2014-10-20 LAB — CBC WITH DIFFERENTIAL/PLATELET
BASOS ABS: 0 10*3/uL (ref 0.0–0.1)
Basophils Relative: 0 % (ref 0–1)
Eosinophils Absolute: 0 10*3/uL (ref 0.0–0.7)
Eosinophils Relative: 0 % (ref 0–5)
HCT: 40.7 % (ref 39.0–52.0)
Hemoglobin: 13.5 g/dL (ref 13.0–17.0)
LYMPHS PCT: 20 % (ref 12–46)
Lymphs Abs: 1.8 10*3/uL (ref 0.7–4.0)
MCH: 28 pg (ref 26.0–34.0)
MCHC: 33.2 g/dL (ref 30.0–36.0)
MCV: 84.3 fL (ref 78.0–100.0)
Monocytes Absolute: 0.4 10*3/uL (ref 0.1–1.0)
Monocytes Relative: 4 % (ref 3–12)
NEUTROS ABS: 6.7 10*3/uL (ref 1.7–7.7)
Neutrophils Relative %: 76 % (ref 43–77)
Platelets: 161 10*3/uL (ref 150–400)
RBC: 4.83 MIL/uL (ref 4.22–5.81)
RDW: 14.3 % (ref 11.5–15.5)
WBC: 8.8 10*3/uL (ref 4.0–10.5)

## 2014-10-20 LAB — COMPREHENSIVE METABOLIC PANEL
ALK PHOS: 86 U/L (ref 39–117)
ALT: 14 U/L (ref 0–53)
AST: 19 U/L (ref 0–37)
Albumin: 4.2 g/dL (ref 3.5–5.2)
Anion gap: 14 (ref 5–15)
BILIRUBIN TOTAL: 0.4 mg/dL (ref 0.3–1.2)
BUN: 19 mg/dL (ref 6–23)
CHLORIDE: 101 meq/L (ref 96–112)
CO2: 25 meq/L (ref 19–32)
CREATININE: 1.28 mg/dL (ref 0.50–1.35)
Calcium: 9.4 mg/dL (ref 8.4–10.5)
GFR calc Af Amer: 55 mL/min — ABNORMAL LOW (ref >90)
GFR calc non Af Amer: 48 mL/min — ABNORMAL LOW (ref >90)
Glucose, Bld: 143 mg/dL — ABNORMAL HIGH (ref 70–99)
POTASSIUM: 4.4 meq/L (ref 3.7–5.3)
Sodium: 140 mEq/L (ref 137–147)
Total Protein: 7.3 g/dL (ref 6.0–8.3)

## 2014-10-20 LAB — I-STAT TROPONIN, ED: Troponin i, poc: 0.01 ng/mL (ref 0.00–0.08)

## 2014-10-20 LAB — URINALYSIS, ROUTINE W REFLEX MICROSCOPIC
BILIRUBIN URINE: NEGATIVE
GLUCOSE, UA: NEGATIVE mg/dL
Hgb urine dipstick: NEGATIVE
Leukocytes, UA: NEGATIVE
NITRITE: NEGATIVE
PH: 5.5 (ref 5.0–8.0)
Protein, ur: NEGATIVE mg/dL
SPECIFIC GRAVITY, URINE: 1.025 (ref 1.005–1.030)
Urobilinogen, UA: 0.2 mg/dL (ref 0.0–1.0)

## 2014-10-20 LAB — RAPID URINE DRUG SCREEN, HOSP PERFORMED
Amphetamines: NOT DETECTED
BARBITURATES: NOT DETECTED
Benzodiazepines: NOT DETECTED
Cocaine: NOT DETECTED
Opiates: NOT DETECTED
TETRAHYDROCANNABINOL: NOT DETECTED

## 2014-10-20 LAB — I-STAT CHEM 8, ED
BUN: 20 mg/dL (ref 6–23)
CHLORIDE: 103 meq/L (ref 96–112)
Calcium, Ion: 1.19 mmol/L (ref 1.13–1.30)
Creatinine, Ser: 1.3 mg/dL (ref 0.50–1.35)
Glucose, Bld: 143 mg/dL — ABNORMAL HIGH (ref 70–99)
HEMATOCRIT: 43 % (ref 39.0–52.0)
HEMOGLOBIN: 14.6 g/dL (ref 13.0–17.0)
Potassium: 4.2 mEq/L (ref 3.7–5.3)
SODIUM: 139 meq/L (ref 137–147)
TCO2: 24 mmol/L (ref 0–100)

## 2014-10-20 LAB — APTT: aPTT: 26 seconds (ref 24–37)

## 2014-10-20 LAB — PROTIME-INR
INR: 1.01 (ref 0.00–1.49)
PROTHROMBIN TIME: 13.4 s (ref 11.6–15.2)

## 2014-10-20 LAB — ETHANOL: Alcohol, Ethyl (B): <11 mg/dL (ref 0–11)

## 2014-10-20 LAB — GLUCOSE, CAPILLARY: GLUCOSE-CAPILLARY: 131 mg/dL — AB (ref 70–99)

## 2014-10-20 LAB — CBG MONITORING, ED: GLUCOSE-CAPILLARY: 137 mg/dL — AB (ref 70–99)

## 2014-10-20 MED ORDER — SIMVASTATIN 10 MG PO TABS
10.0000 mg | ORAL_TABLET | Freq: Every day | ORAL | Status: DC
  Administered 2014-10-21: 10 mg via ORAL
  Filled 2014-10-20: qty 1

## 2014-10-20 MED ORDER — ASPIRIN 325 MG PO TABS
325.0000 mg | ORAL_TABLET | Freq: Every day | ORAL | Status: DC
  Administered 2014-10-20 – 2014-10-21 (×2): 325 mg via ORAL
  Filled 2014-10-20 (×2): qty 1

## 2014-10-20 MED ORDER — STROKE: EARLY STAGES OF RECOVERY BOOK
Freq: Once | Status: AC
  Administered 2014-10-21: 13:00:00
  Filled 2014-10-20: qty 1

## 2014-10-20 MED ORDER — LISINOPRIL 10 MG PO TABS
10.0000 mg | ORAL_TABLET | Freq: Every day | ORAL | Status: DC
  Administered 2014-10-21: 10 mg via ORAL
  Filled 2014-10-20: qty 1

## 2014-10-20 MED ORDER — TRAMADOL HCL 50 MG PO TABS: 50.0000 mg | ORAL_TABLET | Freq: Four times a day (QID) | ORAL | Status: DC | PRN

## 2014-10-20 MED ORDER — ENOXAPARIN SODIUM 40 MG/0.4ML ~~LOC~~ SOLN
40.0000 mg | SUBCUTANEOUS | Status: DC
  Administered 2014-10-20: 40 mg via SUBCUTANEOUS
  Filled 2014-10-20: qty 0.4

## 2014-10-20 NOTE — ED Provider Notes (Signed)
CSN: [id number]     Arrival date & time 10/20/14  1634 History  This chart was scribed for Ezequiel Essex, MD by Jeanell Sparrow, ED Scribe. This patient was seen in room APA01/APA01 and the patient's care was started at 5:05 PM.   Chief Complaint  Patient presents with  . Code Stroke   The history is provided by the patient, the spouse and a relative. No language interpreter was used.   HPI Comments: NOLYN SWAB is a 80 y.o. male who presents to the Emergency Department complaining of a code stroke that started today. His family reports that he was last normal last night. They report that he woke up this morning confused. They state that he has difficulty with memory and "keeps repeating himself". Daughter states that pt had an episode of memory loss about a week ago, but asides from that has been normal. Family reports that the right side of his mouth is drooping. They state that pt has a hx of HTN and high cholesterol. He denies any difficulty ambulating, chest pain, headache, decreased appetite, abdominal pain, dizziness, or light headedness.   Past Medical History  Diagnosis Date  . Hypertension   . Hypercholesterolemia   . Hernia   . Diabetes mellitus   . Hypercholesterolemia   . Leg cramps     at night   . Leukemia    Past Surgical History  Procedure Laterality Date  . Hemorrhoid surgery    . Hernia repair      laparoscopic ventral -[facility]. Total of 4 hernia surgeries  . Cataract extraction w/phaco  05/16/2012    Procedure: CATARACT EXTRACTION PHACO AND INTRAOCULAR LENS PLACEMENT (IOC);  Surgeon: Tonny Branch, MD;  Location: [facility];  Service: Ophthalmology;  Laterality: Left;  CDE:17.23  . Cataract extraction w/phaco  07/14/2012    Procedure: CATARACT EXTRACTION PHACO AND INTRAOCULAR LENS PLACEMENT (IOC);  Surgeon: Tonny Branch, MD;  Location: [facility];  Service: Ophthalmology;  Laterality: Right;  CDE=15.85   No family history on file. History  Substance Use Topics  .  Smoking status: Never Smoker   . Smokeless tobacco: Not on file  . Alcohol Use: No    Review of Systems A complete 10 system review of systems was obtained and all systems are negative except as noted in the HPI and PMH.   Allergies  Anesthetics, amide; Azulfidine; Codeine; Penicillins; and Sulfa antibiotics  Home Medications   Prior to Admission medications   Medication Sig Start Date End Date Taking? Authorizing Provider  aspirin EC 81 MG tablet Take 81 mg by mouth daily.    Yes Historical Provider, MD  Cinnamon 500 MG TABS Take 1 tablet by mouth every morning.    Yes Historical Provider, MD  lisinopril (PRINIVIL,ZESTRIL) 10 MG tablet Take 10 mg by mouth daily.   Yes Historical Provider, MD  simvastatin (ZOCOR) 20 MG tablet Take 10 mg by mouth daily.    Yes Historical Provider, MD  traMADol (ULTRAM) 50 MG tablet Take 50 mg by mouth every 6 (six) hours as needed for moderate pain.   Yes Historical Provider, MD   BP 196/97 mmHg  Pulse 76  Temp(Src) 98.7 F (37.1 C) (Oral)  Resp 16  Ht 5\' 7"  (1.702 m)  Wt 190 lb (86.183 kg)  BMI 29.75 kg/m2  SpO2 99% Physical Exam  Constitutional: He is oriented to person, place, and time. He appears well-developed and well-nourished. No distress.  Alert and oriented x2. Disoriented x2.  HENT:  Head: Normocephalic and atraumatic.  Mouth/Throat: Oropharynx is clear and moist. No oropharyngeal exudate.  Eyes: Conjunctivae and EOM are normal. Pupils are equal, round, and reactive to light.  Neck: Normal range of motion. Neck supple.  No meningismus.  Cardiovascular: Normal rate, regular rhythm, normal heart sounds and intact distal pulses.   No murmur heard. Pulmonary/Chest: Effort normal and breath sounds normal. No respiratory distress.  Abdominal: Soft. There is no tenderness. There is no rebound and no guarding.  Musculoskeletal: Normal range of motion. He exhibits no edema or tenderness.  Neurological: He is alert and oriented to person,  place, and time. No cranial nerve deficit. He exhibits normal muscle tone. Coordination normal.  No ataxia on finger to nose bilaterally. No pronator drift. 5/5 strength throughout. Slight right facial droop.  CN 2-12 intact. Negative Romberg. Equal grip strength. Sensation intact. Gait not tested.  Skin: Skin is warm.  Psychiatric: He has a normal mood and affect. His behavior is normal.  Nursing note and vitals reviewed.   ED Course  Procedures (including critical care time) DIAGNOSTIC STUDIES: Oxygen Saturation is 99% on RA, normal by my interpretation.    COORDINATION OF CARE: 5:09 PM- Pt advised of plan for treatment which includes radiology and labs and pt agrees.  Labs Review Labs Reviewed  COMPREHENSIVE METABOLIC PANEL - Abnormal; Notable for the following:    Glucose, Bld 143 (*)    GFR calc non Af Amer 48 (*)    GFR calc Af Amer 55 (*)    All other components within normal limits  URINALYSIS, ROUTINE W REFLEX MICROSCOPIC - Abnormal; Notable for the following:    Ketones, ur TRACE (*)    All other components within normal limits  GLUCOSE, CAPILLARY - Abnormal; Notable for the following:    Glucose-Capillary 131 (*)    All other components within normal limits  CBG MONITORING, ED - Abnormal; Notable for the following:    Glucose-Capillary 137 (*)    All other components within normal limits  I-STAT CHEM 8, ED - Abnormal; Notable for the following:    Glucose, Bld 143 (*)    All other components within normal limits  CBC WITH DIFFERENTIAL  ETHANOL  PROTIME-INR  APTT  URINE RAPID DRUG SCREEN (HOSP PERFORMED)  HEMOGLOBIN A1C  LIPID PANEL  URINE RAPID DRUG SCREEN (HOSP PERFORMED)  I-STAT TROPOININ, ED  I-STAT TROPOININ, ED  CBG MONITORING, ED    Imaging Review Ct Head Wo Contrast  10/20/2014   CLINICAL DATA:  Patient's family reports that he was last normal last night. They report that he woke up this morning confused. They state that he has difficulty with  memory and "keeps repeating himself". Daughter states that patient had an episode of memory loss about a week ago, but asides from that has been normal. Family reports that the right side of his mouth is drooping. They state that pt has a hx of HTN and high cholesterol.  EXAM: CT HEAD WITHOUT CONTRAST  TECHNIQUE: Contiguous axial images were obtained from the base of the skull through the vertex without intravenous contrast.  COMPARISON:  None.  FINDINGS: There is mild central and cortical atrophy. Periventricular white matter change is consistent with small vessel disease. There small old lacunar infarcts involve the basal ganglia bilaterally. There is no intra or extra-axial fluid collection or mass lesion. The basilar cisterns and ventricles have a normal appearance. There is no CT evidence for acute infarction or hemorrhage. Bone windows show no  calvarial fracture. There is dense atherosclerotic calcification of the internal carotid arteries.  IMPRESSION: 1. Atrophy and small vessel disease. 2.  No evidence for acute intracranial abnormality.   Electronically Signed   By: Shon Hale M.D.   On: 10/20/2014 18:10     EKG Interpretation None      MDM   Final diagnoses:  Facial droop   Patient with memory loss, confusion, difficulty remembering things for the past day. Noticed to have facial droop. Last seen normal at 9 PM last night.  Patient not TPA candidate secondary to delayed presentation. CT head is negative for acute abnormality.  Labs unremarkable. Neuro exam is otherwise unremarkable. He is oriented 2. He has mild confusion. Family states that is improving.  Patient will be admitted for TIA/CVA workup and rule out. D/w Dr. Nehemiah Settle.   I personally performed the services described in this documentation, which was scribed in my presence. The recorded information has been reviewed and is accurate.   Ezequiel Essex, MD 10/21/14 [phone]

## 2014-10-20 NOTE — ED Notes (Signed)
Report given to [place] on Dept 300. All questions answered.

## 2014-10-20 NOTE — H&P (Signed)
History and Physical  [name] Mcdaniel:[id number] DOB: [birth year] DOA: 10/20/2014  Referring physician: Dr Wyvonnia Dusky, ED physician PCP: Jilda Panda, MD   Chief Complaint: Short-term memory loss, right facial droop  HPI: Jeremy Mcdaniel is a 80 y.o. male  With a history of hypertension, hypercholesterolemia, CLL status post chemotherapy. Presents to the hospital with onset of short-term memories loss that was noticed this morning when he woke up. His wife states that he had been acting strange all day, forgetting simple things and things that he normally did. He normally has full function and is able to easily recall things. There is no notice of weakness, clumsiness, ataxia, slurred speech, dizziness, lightheadedness   Review of Systems:  Patient seen with his wife, sons, extended family.  Pt denies any fevers, chills, nausea, vomiting, chest pain, shortness of breath, palpitations, weakness, lightheadedness, dizziness, blurred vision, falls.  Review of systems are otherwise negative  Past Medical History  Diagnosis Date  . Hypertension   . Hypercholesterolemia   . Hernia   . Diabetes mellitus   . Hypercholesterolemia   . Leg cramps     at night   . Leukemia    Past Surgical History  Procedure Laterality Date  . Hemorrhoid surgery    . Hernia repair      laparoscopic ventral -[facility] 3 [facility]. Total of 4 hernia surgeries  . Cataract extraction w/phaco  05/16/2012    Procedure: CATARACT EXTRACTION PHACO AND INTRAOCULAR LENS PLACEMENT (IOC);  Surgeon: Tonny Branch, MD;  Location: [facility];  Service: Ophthalmology;  Laterality: Left;  CDE:17.23  . Cataract extraction w/phaco  07/14/2012    Procedure: CATARACT EXTRACTION PHACO AND INTRAOCULAR LENS PLACEMENT (IOC);  Surgeon: Tonny Branch, MD;  Location: [facility];  Service: Ophthalmology;  Laterality: Right;  CDE=15.85   Social History:  reports that he has never smoked. He does not have any smokeless tobacco history on file. He reports that he does  not drink alcohol or use illicit drugs. Patient lives at home & is able to participate in activities of daily living  Allergies  Allergen Reactions  . Anesthetics, Amide Rash    Had medicine to put him to sleep for hernia repair -and he broke out in rash . He got benadryl for it  . Azulfidine [Sulfasalazine] Rash  . Codeine Rash  . Penicillins Rash  . Sulfa Antibiotics Rash    No family history on file.    Prior to Admission medications   Medication Sig Start Date End Date Taking? Authorizing Provider  aspirin EC 81 MG tablet Take 81 mg by mouth daily.    Yes Historical Provider, MD  Cinnamon 500 MG TABS Take 1 tablet by mouth every morning.    Yes Historical Provider, MD  lisinopril (PRINIVIL,ZESTRIL) 10 MG tablet Take 10 mg by mouth daily.   Yes Historical Provider, MD  simvastatin (ZOCOR) 20 MG tablet Take 10 mg by mouth daily.    Yes Historical Provider, MD  traMADol (ULTRAM) 50 MG tablet Take 50 mg by mouth every 6 (six) hours as needed for moderate pain.   Yes Historical Provider, MD    Physical Exam: BP 172/77 mmHg  Pulse 76  Temp(Src) 98.7 F (37.1 C) (Oral)  Resp 21  Ht 5\' 7"  (1.702 m)  Wt 86.183 kg (190 lb)  BMI 29.75 kg/m2  SpO2 100%  General: Elderly Caucasian male. Awake and alert and oriented to person and place. Remembers year, but not month or day.. No acute cardiopulmonary distress.  Eyes: Pupils equal, round, reactive to light. Extraocular muscles are intact. Sclerae anicteric and noninjected.  ENT: External auditory canals are patent and tympanic membranes reflect a good cone of light. Moist mucosal membranes. No mucosal lesions. Neck: Neck supple without lymphadenopathy. No carotid bruits. No masses palpated.  Cardiovascular: Regular rate with normal S1-S2 sounds. No murmurs, rubs, gallops auscultated. No JVD.  Respiratory: Good respiratory effort with no wheezes, rales, rhonchi. Lungs clear to auscultation bilaterally.  Abdomen: Soft, nontender,  nondistended. Active bowel sounds. No masses or hepatosplenomegaly  Skin: Dry, warm to touch. 2+ dorsalis pedis and radial pulses. Musculoskeletal: No calf or leg pain. All major joints not erythematous nontender.  Psychiatric: Intact judgment and insight.  Neurologic: No focal neurological deficits. Cranial nerves II through XII are grossly intact. She has 5 out of 5 in upper and lower extremities bilaterally. Coordination is intact. Negative Romberg's           Labs on Admission:  Basic Metabolic Panel:  Recent Labs Lab 10/20/14 1705 10/20/14 1725  NA 140 139  K 4.4 4.2  CL 101 103  CO2 25  --   GLUCOSE 143* 143*  BUN 19 20  CREATININE 1.28 1.30  CALCIUM 9.4  --    Liver Function Tests:  Recent Labs Lab 10/20/14 1705  AST 19  ALT 14  ALKPHOS 86  BILITOT 0.4  PROT 7.3  ALBUMIN 4.2   No results for input(s): LIPASE, AMYLASE in the last 168 hours. No results for input(s): AMMONIA in the last 168 hours. CBC:  Recent Labs Lab 10/20/14 1705 10/20/14 1725  WBC 8.8  --   NEUTROABS 6.7  --   HGB 13.5 14.6  HCT 40.7 43.0  MCV 84.3  --   PLT 161  --    Cardiac Enzymes: No results for input(s): CKTOTAL, CKMB, CKMBINDEX, TROPONINI in the last 168 hours.  BNP (last 3 results) No results for input(s): PROBNP in the last 8760 hours. CBG:  Recent Labs Lab 10/20/14 1703  GLUCAP 137*    Radiological Exams on Admission: Ct Head Wo Contrast  10/20/2014   CLINICAL DATA:  Patient's family reports that he was last normal last night. They report that he woke up this morning confused. They state that he has difficulty with memory and "keeps repeating himself". Daughter states that patient had an episode of memory loss about a week ago, but asides from that has been normal. Family reports that the right side of his mouth is drooping. They state that pt has a hx of HTN and high cholesterol.  EXAM: CT HEAD WITHOUT CONTRAST  TECHNIQUE: Contiguous axial images were obtained  from the base of the skull through the vertex without intravenous contrast.  COMPARISON:  None.  FINDINGS: There is mild central and cortical atrophy. Periventricular white matter change is consistent with small vessel disease. There small old lacunar infarcts involve the basal ganglia bilaterally. There is no intra or extra-axial fluid collection or mass lesion. The basilar cisterns and ventricles have a normal appearance. There is no CT evidence for acute infarction or hemorrhage. Bone windows show no calvarial fracture. There is dense atherosclerotic calcification of the internal carotid arteries.  IMPRESSION: 1. Atrophy and small vessel disease. 2.  No evidence for acute intracranial abnormality.   Electronically Signed   By: Shon Hale M.D.   On: 10/20/2014 18:10    EKG: Independently reviewed. Sinus rhythm at 76 bpm. PR interval is 0.216, which is slightly prolonged. QRS is 0.11,  suggestive of incomplete right bundle block. No ST changes.  Assessment/Plan Present on Admission:  . Facial droop . Short-term memory loss  #1 facial droop #2 short-term memory loss  We'll admit the patient for TIA workup. Much of this appears to be resolving. Will admit for observation and obtain MRI, MRA in the morning. Will also obtain echocardiogram and carotid Dopplers. Will provide with full strength aspirin. Continue home medications.  DVT prophylaxis: Lovenox  Consultants: None  Code Status: Full code  Family Communication: Wife, sons, extend family in the room   Disposition Plan: Home following testing  Time spent: 55 minutes  [place], [place] [facility] Pager [phone]

## 2014-10-20 NOTE — ED Notes (Signed)
PT's family reports that pt has been having short term memory loss today. PT denies any pain/ CP or SOB today. PT states he doesn't realize he is forgetting things today. Wife reports last normal yesterday before going to bed. PT has a small droop noted to right side of mouth.

## 2014-10-21 ENCOUNTER — Observation Stay (HOSPITAL_COMMUNITY): Payer: Medicare Other

## 2014-10-21 ENCOUNTER — Encounter (HOSPITAL_COMMUNITY): Payer: Self-pay | Admitting: Internal Medicine

## 2014-10-21 DIAGNOSIS — I5189 Other ill-defined heart diseases: Secondary | ICD-10-CM

## 2014-10-21 DIAGNOSIS — E119 Type 2 diabetes mellitus without complications: Secondary | ICD-10-CM

## 2014-10-21 DIAGNOSIS — I359 Nonrheumatic aortic valve disorder, unspecified: Secondary | ICD-10-CM

## 2014-10-21 DIAGNOSIS — I519 Heart disease, unspecified: Secondary | ICD-10-CM

## 2014-10-21 DIAGNOSIS — G459 Transient cerebral ischemic attack, unspecified: Secondary | ICD-10-CM | POA: Diagnosis present

## 2014-10-21 HISTORY — DX: Other ill-defined heart diseases: I51.89

## 2014-10-21 HISTORY — DX: Transient cerebral ischemic attack, unspecified: G45.9

## 2014-10-21 LAB — LIPID PANEL
Cholesterol: 148 mg/dL (ref 0–200)
HDL: 47 mg/dL (ref >39)
LDL Cholesterol: 74 mg/dL (ref 0–99)
Total CHOL/HDL Ratio: 3.1 RATIO
Triglycerides: 137 mg/dL (ref <150)
VLDL: 27 mg/dL (ref 0–40)

## 2014-10-21 LAB — GLUCOSE, CAPILLARY
GLUCOSE-CAPILLARY: 108 mg/dL — AB (ref 70–99)
Glucose-Capillary: 100 mg/dL — ABNORMAL HIGH (ref 70–99)
Glucose-Capillary: 154 mg/dL — ABNORMAL HIGH (ref 70–99)

## 2014-10-21 LAB — HEMOGLOBIN A1C
HEMOGLOBIN A1C: 6.5 % — AB (ref <5.7)
MEAN PLASMA GLUCOSE: 140 mg/dL — AB (ref <117)

## 2014-10-21 MED ORDER — PERFLUTREN LIPID MICROSPHERE
1.0000 mL | INTRAVENOUS | Status: AC | PRN
  Administered 2014-10-21 (×2): 2 mL via INTRAVENOUS
  Filled 2014-10-21: qty 10

## 2014-10-21 MED ORDER — INSULIN ASPART 100 UNIT/ML ~~LOC~~ SOLN
0.0000 [IU] | Freq: Three times a day (TID) | SUBCUTANEOUS | Status: DC
  Administered 2014-10-21: 2 [IU] via SUBCUTANEOUS

## 2014-10-21 MED ORDER — INSULIN ASPART 100 UNIT/ML ~~LOC~~ SOLN: 0.0000 [IU] | Freq: Every day | SUBCUTANEOUS | Status: DC

## 2014-10-21 MED ORDER — ASPIRIN EC 81 MG PO TBEC: 162.0000 mg | DELAYED_RELEASE_TABLET | Freq: Every day | ORAL | Status: AC

## 2014-10-21 NOTE — Discharge Summary (Signed)
Physician Discharge Summary  Jeremy Mcdaniel BPZ:[id number] DOB: 06-11-1923 DOA: 10/20/2014  PCP: Jilda Panda, MD  Admit date: 10/20/2014 Discharge date: 10/21/2014  Time spent: greater than 30 minutes  Recommendations for Outpatient Follow-up:  1. Recommend follow-up of his hemoglobin A1c pending at the time of discharge. Recommend ongoing monitoring of diet controlled diabetes.    Discharge Diagnoses:  1. Probable TIA with presenting transient symptoms of short-term memory loss and facial droop. 2. Diastolic dysfunction per 2-D echocardiogram-grade 2 diastolic dysfunction; ejection fraction 60-65%. 3. Hyperlipidemia. Well controlled. Total cholesterol 148, triglycerides 137, HDL cholesterol 47, and LDL cholesterol 74. 4. Hypertension. Well controlled. 5. Diet-controlled type 2 diabetes mellitus.  Discharge Condition: Improved  Diet recommendation: Carbohydrate modified/heart healthy.  Filed Weights   10/20/14 1652 10/20/14 2043  Weight: 86.183 kg (190 lb) 86.5 kg (190 lb 11.2 oz)    History of present illness:  The patient is an 80 year old man with a history of hypertension, diet-controlled diabetes mellitus, hyperlipidemia, and CLL. He presented to the emergency department on 10/20/14 with a report of short-term memory loss and transient confusion. There was a suggestion of a right facial droop in the ED, but the family did not notice this. In the ED, he was afebrile and hemodynamically stable. His lab data were significant for a negative urine drug screen, negative troponin I, and venous glucose of 143. CT of his head revealed atrophy and small vessel disease but no acute intracranial abnormalities. EKG revealed incomplete bundle branch block, poor R-wave progression, and a heart rate of 76 bpm. He was admitted for further evaluation and management.  Hospital Course:  The patient apparently passed a swallow evaluation in the emergency department. Home dose of 81 mg of aspirin was  held in favor of dose aspirin at 325 mg daily. He was continued on statin therapy and his antihypertensive medication. For further evaluation, a number of studies were ordered. MRI of the brain revealed chronic small vessel ischemic changes, but no acute intracranial findings. MRI of the brain revealed no stenosis or occlusion of major vessels. His carotid ultrasound revealed no significant ICA stenosis. His 2-D echocardiogram revealed grade 2 diastolic dysfunction, preserved left ventricular systolic function with an ejection fraction of 60-65%, and no left ventricular regional wall motion abnormalities. There was no clinical evidence of heart failure. The results of his fasting lipid panel were dictated above. His capillary blood glucose ranged from 100-143. The patient remained afebrile and hemodynamically stable. Upon examination, he had no obvious facial droop, no unilateral or focal weakness, and no abnormalities with his gait. His cranial nerves appeared to be intact. His memory appeared to be intact short-term and long-term per my observation. The etiology of his symptoms were likely a TIA. Would recommend ordering TSH and vitamin B12 in the outpatient setting if his symptoms return. Otherwise, he was instructed to take 162 mg of aspirin ( two 81 milligrams tablets) daily instead of 81 mg daily. He was also instructed to continue Zocor and lisinopril. He was instructed to stand slowly and to avoid dehydration. He was encouraged to check his blood sugar at least several times weekly and to call his primary care physician if his blood sugar was greater than 160 consistently. He was instructed not to drive for at least 5 days.   Procedures:  2-D echocardiogram:Study Conclusions - Left ventricle: The cavity size was normal. Wall thickness was increased in a pattern of mild LVH. Systolic function was normal. The estimated ejection fraction was in the range  of 60% to 65%. Wall motion was normal;  there were no regional wall motion abnormalities. Features are consistent with a pseudonormal left ventricular filling pattern, with concomitant abnormal relaxation and increased filling pressure (grade 2 diastolic dysfunction). - Aortic valve: Moderately calcified annulus. Trileaflet; moderately thickened leaflets. There was mild regurgitation. Valve area (VTI): 2.01 cm^2. Valve area (Vmax): 1.85 cm^2. Regurgitation pressure half-time: 709 ms. - Aorta: The visualized portion of the proximal ascending aorta is mildly dilated at 3.8 cm. - Mitral valve: Mildly calcified annulus. Mildly thickened leaflets - Left atrium: The atrium was mildly dilated. - Echocontrast was used to enhance images.  Consultations:  None  Discharge Exam: Filed Vitals:   10/21/14 1400  BP: 123/55  Pulse: 62  Temp: 99 F (37.2 C)  Resp: 20   oxygen saturation 97% on room air.  General: Pleasant 80 year old man sitting up in bed, in no acute distress. Cardiovascular: S1, S2, with a soft systolic murmur. Respiratory: Clear to auscultation bilaterally. Neurologic: He is alert and oriented 3. Cranial nerves II through XII are intact. Strength is 5 over 5 throughout. Gait is intact.  Discharge Instructions You were cared for by a hospitalist during your hospital stay. If you have any questions about your discharge medications or the care you received while you were in the hospital after you are discharged, you can call the unit and asked to speak with the hospitalist on call if the hospitalist that took care of you is not available. Once you are discharged, your primary care physician will handle any further medical issues. Please note that NO REFILLS for any discharge medications will be authorized once you are discharged, as it is imperative that you return to your primary care physician (or establish a relationship with a primary care physician if you do not have one) for your aftercare needs so  that they can reassess your need for medications and monitor your lab values.  Discharge Instructions    Diet - low sodium heart healthy    Complete by:  As directed      Diet Carb Modified    Complete by:  As directed      Discharge instructions    Complete by:  As directed   1. Stand slowly. 2. Avoid dehydration. 3. Check your blood sugars several times each week and follow up with your doctor if it is more than 160 consistently. 4. Do not drive for 5 days. 5. Take your medications as prescribed.     Driving Restrictions    Complete by:  As directed   No driving for 5 days.     Increase activity slowly    Complete by:  As directed           Current Discharge Medication List    CONTINUE these medications which have CHANGED   Details  aspirin EC 81 MG tablet Take 2 tablets (162 mg total) by mouth daily.      CONTINUE these medications which have NOT CHANGED   Details  Cinnamon 500 MG TABS Take 1 tablet by mouth every morning.     lisinopril (PRINIVIL,ZESTRIL) 10 MG tablet Take 10 mg by mouth daily.    simvastatin (ZOCOR) 20 MG tablet Take 10 mg by mouth daily.     traMADol (ULTRAM) 50 MG tablet Take 50 mg by mouth every 6 (six) hours as needed for moderate pain.       Allergies  Allergen Reactions  . Anesthetics, Amide Rash    Had  medicine to put him to sleep for hernia repair -and he broke out in rash . He got benadryl for it  . Azulfidine [Sulfasalazine] Rash  . Codeine Rash  . Penicillins Rash  . Sulfa Antibiotics Rash   Follow-up Information    Follow up with Jilda Panda, MD.   Specialty:  Internal Medicine   Why:  In 1-2 weeks.   Contact information:   [address] [place] [address] [phone]        The results of significant diagnostics from this hospitalization (including imaging, microbiology, ancillary and laboratory) are listed below for reference.    Significant Diagnostic Studies: Ct Head Wo Contrast  10/20/2014   CLINICAL DATA:   Patient's family reports that he was last normal last night. They report that he woke up this morning confused. They state that he has difficulty with memory and "keeps repeating himself". Daughter states that patient had an episode of memory loss about a week ago, but asides from that has been normal. Family reports that the right side of his mouth is drooping. They state that pt has a hx of HTN and high cholesterol.  EXAM: CT HEAD WITHOUT CONTRAST  TECHNIQUE: Contiguous axial images were obtained from the base of the skull through the vertex without intravenous contrast.  COMPARISON:  None.  FINDINGS: There is mild central and cortical atrophy. Periventricular white matter change is consistent with small vessel disease. There small old lacunar infarcts involve the basal ganglia bilaterally. There is no intra or extra-axial fluid collection or mass lesion. The basilar cisterns and ventricles have a normal appearance. There is no CT evidence for acute infarction or hemorrhage. Bone windows show no calvarial fracture. There is dense atherosclerotic calcification of the internal carotid arteries.  IMPRESSION: 1. Atrophy and small vessel disease. 2.  No evidence for acute intracranial abnormality.   Electronically Signed   By: Shon Hale M.D.   On: 10/20/2014 18:10   Mr Jodene Nam Head Wo Contrast  10/21/2014   CLINICAL DATA:  New onset of memory loss and dizziness, 2 days duration.  EXAM: MRI HEAD WITHOUT CONTRAST  MRA HEAD WITHOUT CONTRAST  TECHNIQUE: Multiplanar, multiecho pulse sequences of the brain and surrounding structures were obtained without intravenous contrast. Angiographic images of the head were obtained using MRA technique without contrast.  COMPARISON:  Head CT 10/20/2014.  MRI 06/04/2008.  FINDINGS: MRI HEAD FINDINGS  Diffusion imaging does not show any acute or subacute infarction. There are mild chronic small-vessel ischemic changes of the pons. No focal cerebellar insult. Within the cerebral  hemispheres, there are chronic small-vessel ischemic changes affecting the deep and subcortical white matter. No cortical or large vessel territory infarction. No mass lesion, hemorrhage, hydrocephalus or extra-axial collection. No pituitary mass. No inflammatory sinus disease. No skull or skullbase lesion.  MRA HEAD FINDINGS  Both internal carotid arteries are widely patent into the brain. The anterior and middle cerebral vessels are patent without proximal stenosis, aneurysm or vascular malformation. There is signal loss relating to tortuosity, not pathologic. Both vertebral arteries are widely patent to the basilar. No basilar stenosis. Posterior circulation branch vessels are patent.  IMPRESSION: No acute or reversible finding.  Ordinary chronic small-vessel ischemic changes, less than often seen in healthy individuals of this age.  Intracranial MR angiography shows tortuosity of the vessels but no stenosis or occlusion.   Electronically Signed   By: Nelson Chimes M.D.   On: 10/21/2014 15:42   Mri Brain Without Contrast  10/21/2014  CLINICAL DATA:  New onset of memory loss and dizziness, 2 days duration.  EXAM: MRI HEAD WITHOUT CONTRAST  MRA HEAD WITHOUT CONTRAST  TECHNIQUE: Multiplanar, multiecho pulse sequences of the brain and surrounding structures were obtained without intravenous contrast. Angiographic images of the head were obtained using MRA technique without contrast.  COMPARISON:  Head CT 10/20/2014.  MRI 06/04/2008.  FINDINGS: MRI HEAD FINDINGS  Diffusion imaging does not show any acute or subacute infarction. There are mild chronic small-vessel ischemic changes of the pons. No focal cerebellar insult. Within the cerebral hemispheres, there are chronic small-vessel ischemic changes affecting the deep and subcortical white matter. No cortical or large vessel territory infarction. No mass lesion, hemorrhage, hydrocephalus or extra-axial collection. No pituitary mass. No inflammatory sinus disease.  No skull or skullbase lesion.  MRA HEAD FINDINGS  Both internal carotid arteries are widely patent into the brain. The anterior and middle cerebral vessels are patent without proximal stenosis, aneurysm or vascular malformation. There is signal loss relating to tortuosity, not pathologic. Both vertebral arteries are widely patent to the basilar. No basilar stenosis. Posterior circulation branch vessels are patent.  IMPRESSION: No acute or reversible finding.  Ordinary chronic small-vessel ischemic changes, less than often seen in healthy individuals of this age.  Intracranial MR angiography shows tortuosity of the vessels but no stenosis or occlusion.   Electronically Signed   By: Nelson Chimes M.D.   On: 10/21/2014 15:42   US Carotid Duplex Bilateral  10/21/2014   CLINICAL DATA:  80 year old male with short-term memory loss  EXAM: BILATERAL CAROTID DUPLEX ULTRASOUND  TECHNIQUE: Pearline Cables scale imaging, color Doppler and duplex ultrasound were performed of bilateral carotid and vertebral arteries in the neck.  COMPARISON:  Recent CT scan of the head 10/20/2014 ; prior duplex carotid ultrasound 06/04/2008  FINDINGS: Criteria: Quantification of carotid stenosis is based on velocity parameters that correlate the residual internal carotid diameter with NASCET-based stenosis levels, using the diameter of the distal internal carotid lumen as the denominator for stenosis measurement.  The following velocity measurements were obtained:  RIGHT  ICA:  114/15 cm/sec  CCA:  161/09 cm/sec  SYSTOLIC ICA/CCA RATIO:  1.0  DIASTOLIC ICA/CCA RATIO:  1.5  ECA:  157 cm/sec  LEFT  ICA:  128/24 cm/sec  CCA:  604/5 cm/sec  SYSTOLIC ICA/CCA RATIO:  1.2  DIASTOLIC ICA/CCA RATIO:  3.4  ECA:  128 cm/sec  RIGHT CAROTID ARTERY: Very mild heterogeneous atherosclerotic plaque in the internal carotid bulb and proximal internal carotid artery without evidence of stenosis.  RIGHT VERTEBRAL ARTERY:  Patent with normal antegrade flow.  LEFT CAROTID  ARTERY: Mild heterogeneous atherosclerotic plaque in the carotid bifurcation extending into the proximal internal carotid artery. By peak systolic velocity criteria there is no evidence of significant stenosis. The peak systolic velocity is mildly increased distally likely exaggerated due to tortuosity.  LEFT VERTEBRAL ARTERY:  Patent with normal antegrade flow.  IMPRESSION: 1. Very mild heterogeneous atherosclerotic plaque bilaterally without evidence of stenosis. 2. Vertebral arteries are patent with normal antegrade flow. Signed,  Criselda Peaches, MD  [facility]  [facility]   Electronically Signed   By: Jacqulynn Cadet M.D.   On: 10/21/2014 13:22    Microbiology: No results found for this or any previous visit (from the past 240 hour(s)).   Labs: Basic Metabolic Panel:  Recent Labs Lab 10/20/14 1705 10/20/14 1725  NA 140 139  K 4.4 4.2  CL 101 103  CO2 25  --  GLUCOSE 143* 143*  BUN 19 20  CREATININE 1.28 1.30  CALCIUM 9.4  --    Liver Function Tests:  Recent Labs Lab 10/20/14 1705  AST 19  ALT 14  ALKPHOS 86  BILITOT 0.4  PROT 7.3  ALBUMIN 4.2   No results for input(s): LIPASE, AMYLASE in the last 168 hours. No results for input(s): AMMONIA in the last 168 hours. CBC:  Recent Labs Lab 10/20/14 1705 10/20/14 1725  WBC 8.8  --   NEUTROABS 6.7  --   HGB 13.5 14.6  HCT 40.7 43.0  MCV 84.3  --   PLT 161  --    Cardiac Enzymes: No results for input(s): CKTOTAL, CKMB, CKMBINDEX, TROPONINI in the last 168 hours. BNP: BNP (last 3 results) No results for input(s): PROBNP in the last 8760 hours. CBG:  Recent Labs Lab 10/20/14 1703 10/20/14 2039 10/21/14 0753 10/21/14 1135 10/21/14 1641  GLUCAP 137* 131* 100* 154* 108*       Signed:  Jerian Morais  [facility] 10/21/2014, 4:51 PM

## 2014-10-21 NOTE — Plan of Care (Signed)
Problem: Progression Outcomes Goal: Pain controlled Outcome: Completed/Met Date Met:  10/21/14 denies

## 2014-10-21 NOTE — Progress Notes (Signed)
UR completed 

## 2014-10-21 NOTE — Plan of Care (Signed)
Problem: Discharge/Transitional Outcomes Goal: Independent mobility/functioning independent or with min Independent mobility/functioning independently or with minimal assistance  Outcome: Completed/Met Date Met:  10/21/14 Ambulated in halls without difficulty Steady gate

## 2014-10-21 NOTE — Progress Notes (Signed)
  Echocardiogram 2D Echocardiogram has been performed.  Minus Breeding A 10/21/2014, 11:11 AM

## 2014-11-20 ENCOUNTER — Ambulatory Visit (HOSPITAL_BASED_OUTPATIENT_CLINIC_OR_DEPARTMENT_OTHER): Payer: Medicare Other | Admitting: Internal Medicine

## 2014-11-20 ENCOUNTER — Telehealth: Payer: Self-pay | Admitting: Internal Medicine

## 2014-11-20 ENCOUNTER — Other Ambulatory Visit (HOSPITAL_BASED_OUTPATIENT_CLINIC_OR_DEPARTMENT_OTHER): Payer: Medicare Other

## 2014-11-20 ENCOUNTER — Encounter: Payer: Self-pay | Admitting: Internal Medicine

## 2014-11-20 VITALS — BP 149/71 | HR 60 | Temp 98.0°F | Resp 18 | Ht 67.0 in | Wt 189.6 lb

## 2014-11-20 DIAGNOSIS — C9110 Chronic lymphocytic leukemia of B-cell type not having achieved remission: Secondary | ICD-10-CM

## 2014-11-20 LAB — CBC WITH DIFFERENTIAL/PLATELET
BASO%: 0.7 % (ref 0.0–2.0)
Basophils Absolute: 0 10*3/uL (ref 0.0–0.1)
EOS%: 1.3 % (ref 0.0–7.0)
Eosinophils Absolute: 0.1 10*3/uL (ref 0.0–0.5)
HEMATOCRIT: 39.7 % (ref 38.4–49.9)
HGB: 13 g/dL (ref 13.0–17.1)
LYMPH%: 32.1 % (ref 14.0–49.0)
MCH: 27.8 pg (ref 27.2–33.4)
MCHC: 32.7 g/dL (ref 32.0–36.0)
MCV: 85 fL (ref 79.3–98.0)
MONO#: 0.5 10*3/uL (ref 0.1–0.9)
MONO%: 7.7 % (ref 0.0–14.0)
NEUT#: 3.6 10*3/uL (ref 1.5–6.5)
NEUT%: 58.2 % (ref 39.0–75.0)
PLATELETS: 171 10*3/uL (ref 140–400)
RBC: 4.67 10*6/uL (ref 4.20–5.82)
RDW: 14.9 % — ABNORMAL HIGH (ref 11.0–14.6)
WBC: 6.1 10*3/uL (ref 4.0–10.3)
lymph#: 2 10*3/uL (ref 0.9–3.3)

## 2014-11-20 LAB — COMPREHENSIVE METABOLIC PANEL (CC13)
ALT: 10 U/L (ref 0–55)
AST: 16 U/L (ref 5–34)
Albumin: 3.8 g/dL (ref 3.5–5.0)
Alkaline Phosphatase: 76 U/L (ref 40–150)
Anion Gap: 10 mEq/L (ref 3–11)
BILIRUBIN TOTAL: 0.36 mg/dL (ref 0.20–1.20)
BUN: 29.5 mg/dL — AB (ref 7.0–26.0)
CHLORIDE: 109 meq/L (ref 98–109)
CO2: 26 meq/L (ref 22–29)
CREATININE: 1.7 mg/dL — AB (ref 0.7–1.3)
Calcium: 8.9 mg/dL (ref 8.4–10.4)
EGFR: 35 mL/min/{1.73_m2} — ABNORMAL LOW (ref >90)
GLUCOSE: 129 mg/dL (ref 70–140)
Potassium: 4.5 mEq/L (ref 3.5–5.1)
Sodium: 145 mEq/L (ref 136–145)
Total Protein: 6.6 g/dL (ref 6.4–8.3)

## 2014-11-20 LAB — LACTATE DEHYDROGENASE (CC13): LDH: 185 U/L (ref 125–245)

## 2014-11-20 NOTE — Progress Notes (Signed)
[facility] [phone]   [phone]  OFFICE PROGRESS NOTE  Jilda Panda, MD [address] [place] [address]  DIAGNOSIS: Progressive chronic lymphocytic leukemia, diagnosed more than 30 years ago but now with short doubling time.   PRIOR THERAPY: Systemic chemotherapy with Rituxan 375 mg/M2 on day 1 and Bendamustine 70 mg/m2 on days 1 and 2 every 4 weeks. Status post 3 cycles. Last dose was given 01/24/2013 with significant improvement in his disease.   CURRENT THERAPY: Observation.  INTERVAL HISTORY: Jeremy Mcdaniel 81 y.o. male returns to the clinic today for follow up visit accompanied his wife and another family member. The patient is feeling fine today with no specific complaints. He was evaluated recently at [facility] for questionable TIA. He had MRI of the brain on 10/21/2014 that showed no acute or reversible findings. He is still very active. He denied having any significant weight loss or night sweats. He denied having any chest pain, shortness of breath, cough or hemoptysis. He has no palpable lymphadenopathy. He has repeat CBC performed earlier today and he is here for evaluation and discussion of his lab results.  MEDICAL HISTORY: Past Medical History  Diagnosis Date  . Hypertension   . Hypercholesterolemia   . Hernia   . Hypercholesterolemia   . Leg cramps     at night   . CLL (chronic lymphocytic leukemia)   . Diastolic dysfunction 11/17/3233    Grade 2. Ejection fraction 60%.  Marland Kitchen TIA (transient ischemic attack) 10/21/2014  . Diabetes mellitus type II, controlled, with no complications     Diet-controlled    ALLERGIES:  is allergic to anesthetics, amide; azulfidine; codeine; penicillins; and sulfa antibiotics.  MEDICATIONS:  Current Outpatient Prescriptions  Medication Sig Dispense Refill  . aspirin EC 81 MG tablet Take 2 tablets (162 mg total) by mouth daily.    . Cinnamon 500 MG TABS Take 1 tablet by mouth  every morning.     Marland Kitchen lisinopril (PRINIVIL,ZESTRIL) 10 MG tablet Take 10 mg by mouth daily.    . simvastatin (ZOCOR) 20 MG tablet Take 10 mg by mouth daily.     . traMADol (ULTRAM) 50 MG tablet Take 50 mg by mouth every 6 (six) hours as needed for moderate pain.     No current facility-administered medications for this visit.    SURGICAL HISTORY:  Past Surgical History  Procedure Laterality Date  . Hemorrhoid surgery    . Hernia repair      laparoscopic ventral -had 3 at [facility]. Total of 4 hernia surgeries  . Cataract extraction w/phaco  05/16/2012    Procedure: CATARACT EXTRACTION PHACO AND INTRAOCULAR LENS PLACEMENT (IOC);  Surgeon: Tonny Branch, MD;  Location: [facility];  Service: Ophthalmology;  Laterality: Left;  CDE:17.23  . Cataract extraction w/phaco  07/14/2012    Procedure: CATARACT EXTRACTION PHACO AND INTRAOCULAR LENS PLACEMENT (IOC);  Surgeon: Tonny Branch, MD;  Location: [facility];  Service: Ophthalmology;  Laterality: Right;  CDE=15.85    REVIEW OF SYSTEMS:  A comprehensive review of systems was negative.   PHYSICAL EXAMINATION: General appearance: alert, cooperative and no distress Head: Normocephalic, without obvious abnormality, atraumatic Neck: no adenopathy Lymph nodes: Cervical, supraclavicular, and axillary nodes normal. Resp: clear to auscultation bilaterally Cardio: regular rate and rhythm, S1, S2 normal, no murmur, click, rub or gallop GI: soft, non-tender; bowel sounds normal; no masses,  no organomegaly Extremities: extremities normal, atraumatic, no cyanosis or edema  ECOG PERFORMANCE STATUS:  1 - Symptomatic but completely ambulatory  Blood pressure 149/71, pulse 60, temperature 98 F (36.7 C), temperature source Oral, resp. rate 18, height 5\' 7"  (1.702 m), weight 189 lb 9.6 oz (86.002 kg), SpO2 98 %.  LABORATORY DATA: Lab Results  Component Value Date   WBC 6.1 11/20/2014   HGB 13.0 11/20/2014   HCT 39.7 11/20/2014   MCV 85.0 11/20/2014   PLT 171 11/20/2014       Chemistry      Component Value Date/Time   NA 139 10/20/2014 1725   NA 143 05/21/2014 1236   K 4.2 10/20/2014 1725   K 5.2* 05/21/2014 1236   CL 103 10/20/2014 1725   CL 106 02/23/2013 0957   CO2 25 10/20/2014 1705   CO2 26 05/21/2014 1236   BUN 20 10/20/2014 1725   BUN 20.9 05/21/2014 1236   CREATININE 1.30 10/20/2014 1725   CREATININE 1.4* 05/21/2014 1236      Component Value Date/Time   CALCIUM 9.4 10/20/2014 1705   CALCIUM 9.9 05/21/2014 1236   ALKPHOS 86 10/20/2014 1705   ALKPHOS 83 05/21/2014 1236   AST 19 10/20/2014 1705   AST 15 05/21/2014 1236   ALT 14 10/20/2014 1705   ALT 15 05/21/2014 1236   BILITOT 0.4 10/20/2014 1705   BILITOT 0.48 05/21/2014 1236       RADIOGRAPHIC STUDIES: No results found.  ASSESSMENT AND PLAN:  This is a very pleasant 81 years old white male with progressive chronic lymphocytic leukemia status post systemic chemotherapy with Rituxan and Bendamustine. He has significant improvement in his disease after the treatment and he is currently on observation with no evidence for disease progression. I discussed the lab result with the patient and his wife. I recommended for him to continue on observation with repeat CBC, comprehensive metabolic panel and LDH in 6 months.  All questions were answered. The patient knows to call the clinic with any problems, questions or concerns. We can certainly see the patient much sooner if necessary.  Disclaimer: This note was dictated with voice recognition software. Similar sounding words can inadvertently be transcribed and may not be corrected upon review.

## 2014-11-20 NOTE — Telephone Encounter (Signed)
Pt confirmed labs/ov per 01/12 POF, gave pt AVS..... KJ °

## 2015-01-29 ENCOUNTER — Inpatient Hospital Stay (HOSPITAL_COMMUNITY): Payer: Medicare Other

## 2015-01-29 ENCOUNTER — Encounter (HOSPITAL_COMMUNITY): Admission: EM | Disposition: E | Payer: Medicare Other | Source: Home / Self Care | Attending: Internal Medicine

## 2015-01-29 ENCOUNTER — Inpatient Hospital Stay (HOSPITAL_COMMUNITY)
Admission: EM | Admit: 2015-01-29 | Discharge: 2015-02-08 | DRG: 246 | Disposition: E | Payer: Medicare Other | Attending: Internal Medicine | Admitting: Internal Medicine

## 2015-01-29 ENCOUNTER — Other Ambulatory Visit: Payer: Self-pay

## 2015-01-29 ENCOUNTER — Other Ambulatory Visit (HOSPITAL_COMMUNITY): Payer: Self-pay

## 2015-01-29 ENCOUNTER — Encounter (HOSPITAL_COMMUNITY): Payer: Self-pay | Admitting: Emergency Medicine

## 2015-01-29 DIAGNOSIS — Z7982 Long term (current) use of aspirin: Secondary | ICD-10-CM | POA: Diagnosis not present

## 2015-01-29 DIAGNOSIS — I472 Ventricular tachycardia: Secondary | ICD-10-CM

## 2015-01-29 DIAGNOSIS — Z79899 Other long term (current) drug therapy: Secondary | ICD-10-CM

## 2015-01-29 DIAGNOSIS — G253 Myoclonus: Secondary | ICD-10-CM | POA: Diagnosis not present

## 2015-01-29 DIAGNOSIS — Z9861 Coronary angioplasty status: Secondary | ICD-10-CM

## 2015-01-29 DIAGNOSIS — I2109 ST elevation (STEMI) myocardial infarction involving other coronary artery of anterior wall: Secondary | ICD-10-CM | POA: Diagnosis present

## 2015-01-29 DIAGNOSIS — I469 Cardiac arrest, cause unspecified: Secondary | ICD-10-CM

## 2015-01-29 DIAGNOSIS — I4901 Ventricular fibrillation: Secondary | ICD-10-CM | POA: Diagnosis present

## 2015-01-29 DIAGNOSIS — I2510 Atherosclerotic heart disease of native coronary artery without angina pectoris: Secondary | ICD-10-CM

## 2015-01-29 DIAGNOSIS — J9601 Acute respiratory failure with hypoxia: Secondary | ICD-10-CM | POA: Diagnosis present

## 2015-01-29 DIAGNOSIS — R579 Shock, unspecified: Secondary | ICD-10-CM

## 2015-01-29 DIAGNOSIS — K7200 Acute and subacute hepatic failure without coma: Secondary | ICD-10-CM | POA: Diagnosis present

## 2015-01-29 DIAGNOSIS — C9110 Chronic lymphocytic leukemia of B-cell type not having achieved remission: Secondary | ICD-10-CM | POA: Diagnosis present

## 2015-01-29 DIAGNOSIS — E874 Mixed disorder of acid-base balance: Secondary | ICD-10-CM | POA: Diagnosis present

## 2015-01-29 DIAGNOSIS — I213 ST elevation (STEMI) myocardial infarction of unspecified site: Secondary | ICD-10-CM

## 2015-01-29 DIAGNOSIS — Z8673 Personal history of transient ischemic attack (TIA), and cerebral infarction without residual deficits: Secondary | ICD-10-CM | POA: Diagnosis not present

## 2015-01-29 DIAGNOSIS — I2101 ST elevation (STEMI) myocardial infarction involving left main coronary artery: Secondary | ICD-10-CM

## 2015-01-29 DIAGNOSIS — Z4659 Encounter for fitting and adjustment of other gastrointestinal appliance and device: Secondary | ICD-10-CM

## 2015-01-29 DIAGNOSIS — E876 Hypokalemia: Secondary | ICD-10-CM | POA: Diagnosis not present

## 2015-01-29 DIAGNOSIS — Z66 Do not resuscitate: Secondary | ICD-10-CM | POA: Diagnosis not present

## 2015-01-29 DIAGNOSIS — R570 Cardiogenic shock: Secondary | ICD-10-CM | POA: Diagnosis present

## 2015-01-29 DIAGNOSIS — G931 Anoxic brain damage, not elsewhere classified: Secondary | ICD-10-CM | POA: Diagnosis present

## 2015-01-29 DIAGNOSIS — I120 Hypertensive chronic kidney disease with stage 5 chronic kidney disease or end stage renal disease: Secondary | ICD-10-CM | POA: Diagnosis present

## 2015-01-29 DIAGNOSIS — N179 Acute kidney failure, unspecified: Secondary | ICD-10-CM | POA: Diagnosis present

## 2015-01-29 DIAGNOSIS — N186 End stage renal disease: Secondary | ICD-10-CM | POA: Diagnosis present

## 2015-01-29 DIAGNOSIS — D649 Anemia, unspecified: Secondary | ICD-10-CM | POA: Diagnosis present

## 2015-01-29 DIAGNOSIS — E785 Hyperlipidemia, unspecified: Secondary | ICD-10-CM | POA: Diagnosis present

## 2015-01-29 DIAGNOSIS — E11649 Type 2 diabetes mellitus with hypoglycemia without coma: Secondary | ICD-10-CM | POA: Diagnosis not present

## 2015-01-29 DIAGNOSIS — Z515 Encounter for palliative care: Secondary | ICD-10-CM | POA: Diagnosis not present

## 2015-01-29 DIAGNOSIS — Z452 Encounter for adjustment and management of vascular access device: Secondary | ICD-10-CM

## 2015-01-29 DIAGNOSIS — Z978 Presence of other specified devices: Secondary | ICD-10-CM

## 2015-01-29 HISTORY — PX: LEFT HEART CATHETERIZATION WITH CORONARY ANGIOGRAM: SHX5451

## 2015-01-29 LAB — CK TOTAL AND CKMB (NOT AT ARMC)
CK TOTAL: 1155 U/L — AB (ref 7–232)
CK, MB: 17 ng/mL (ref 0.3–4.0)
CK, MB: 194.4 ng/mL (ref 0.3–4.0)
Relative Index: 8.2 — ABNORMAL HIGH (ref 0.0–2.5)
Relative Index: 16.8 — ABNORMAL HIGH (ref 0.0–2.5)
Total CK: 207 U/L (ref 7–232)

## 2015-01-29 LAB — COMPREHENSIVE METABOLIC PANEL
ALBUMIN: 3 g/dL — AB (ref 3.5–5.2)
ALK PHOS: 119 U/L — AB (ref 39–117)
ALT: 112 U/L — ABNORMAL HIGH (ref 0–53)
AST: 189 U/L — ABNORMAL HIGH (ref 0–37)
Anion gap: 12 (ref 5–15)
BILIRUBIN TOTAL: 0.5 mg/dL (ref 0.3–1.2)
BUN: 20 mg/dL (ref 6–23)
CHLORIDE: 108 mmol/L (ref 96–112)
CO2: 20 mmol/L (ref 19–32)
Calcium: 7.9 mg/dL — ABNORMAL LOW (ref 8.4–10.5)
Creatinine, Ser: 1.89 mg/dL — ABNORMAL HIGH (ref 0.50–1.35)
GFR calc Af Amer: 35 mL/min — ABNORMAL LOW (ref >90)
GFR calc non Af Amer: 30 mL/min — ABNORMAL LOW (ref >90)
Glucose, Bld: 322 mg/dL — ABNORMAL HIGH (ref 70–99)
POTASSIUM: 4.4 mmol/L (ref 3.5–5.1)
Sodium: 140 mmol/L (ref 135–145)
TOTAL PROTEIN: 5.2 g/dL — AB (ref 6.0–8.3)

## 2015-01-29 LAB — BASIC METABOLIC PANEL
ANION GAP: 15 (ref 5–15)
Anion gap: 16 — ABNORMAL HIGH (ref 5–15)
BUN: 26 mg/dL — AB (ref 6–23)
BUN: 27 mg/dL — AB (ref 6–23)
CALCIUM: 7.6 mg/dL — AB (ref 8.4–10.5)
CO2: 18 mmol/L — ABNORMAL LOW (ref 19–32)
CO2: 19 mmol/L (ref 19–32)
CREATININE: 1.9 mg/dL — AB (ref 0.50–1.35)
Calcium: 7.6 mg/dL — ABNORMAL LOW (ref 8.4–10.5)
Chloride: 104 mmol/L (ref 96–112)
Chloride: 104 mmol/L (ref 96–112)
Creatinine, Ser: 1.97 mg/dL — ABNORMAL HIGH (ref 0.50–1.35)
GFR calc Af Amer: 33 mL/min — ABNORMAL LOW (ref >90)
GFR, EST AFRICAN AMERICAN: 34 mL/min — AB (ref >90)
GFR, EST NON AFRICAN AMERICAN: 30 mL/min — AB (ref >90)
GFR, EST NON AFRICAN AMERICAN: 28 mL/min — AB (ref >90)
GLUCOSE: 370 mg/dL — AB (ref 70–99)
Glucose, Bld: 381 mg/dL — ABNORMAL HIGH (ref 70–99)
Potassium: 5.1 mmol/L (ref 3.5–5.1)
Potassium: 5 mmol/L (ref 3.5–5.1)
Sodium: 138 mmol/L (ref 135–145)
Sodium: 138 mmol/L (ref 135–145)

## 2015-01-29 LAB — POCT I-STAT 3, ART BLOOD GAS (G3+)
ACID-BASE DEFICIT: 13 mmol/L — AB (ref 0.0–2.0)
ACID-BASE DEFICIT: 9 mmol/L — AB (ref 0.0–2.0)
ACID-BASE DEFICIT: 9 mmol/L — AB (ref 0.0–2.0)
BICARBONATE: 16.1 meq/L — AB (ref 20.0–24.0)
BICARBONATE: 17.2 meq/L — AB (ref 20.0–24.0)
Bicarbonate: 16.2 mEq/L — ABNORMAL LOW (ref 20.0–24.0)
O2 SAT: 93 %
O2 SAT: 98 %
O2 Saturation: 94 %
PH ART: 7.288 — AB (ref 7.350–7.450)
PO2 ART: 99 mmHg (ref 80.0–100.0)
TCO2: 17 mmol/L (ref 0–100)
TCO2: 18 mmol/L (ref 0–100)
TCO2: 17 mmol/L (ref 0–100)
pCO2 arterial: 46.6 mmHg — ABNORMAL HIGH (ref 35.0–45.0)
pCO2 arterial: 34.7 mmHg — ABNORMAL LOW (ref 35.0–45.0)
pCO2 arterial: 29.7 mmHg — ABNORMAL LOW (ref 35.0–45.0)
pH, Arterial: 7.146 — CL (ref 7.350–7.450)
pH, Arterial: 7.329 — ABNORMAL LOW (ref 7.350–7.450)
pO2, Arterial: 86 mmHg (ref 80.0–100.0)
pO2, Arterial: 68 mmHg — ABNORMAL LOW (ref 80.0–100.0)

## 2015-01-29 LAB — CREATININE, SERUM
Creatinine, Ser: 1.83 mg/dL — ABNORMAL HIGH (ref 0.50–1.35)
GFR calc Af Amer: 36 mL/min — ABNORMAL LOW (ref >90)
GFR calc non Af Amer: 31 mL/min — ABNORMAL LOW (ref >90)

## 2015-01-29 LAB — CBC
HCT: 37.7 % — ABNORMAL LOW (ref 39.0–52.0)
HEMATOCRIT: 38.8 % — AB (ref 39.0–52.0)
HEMOGLOBIN: 11.7 g/dL — AB (ref 13.0–17.0)
Hemoglobin: 12.4 g/dL — ABNORMAL LOW (ref 13.0–17.0)
MCH: 26.7 pg (ref 26.0–34.0)
MCH: 26.6 pg (ref 26.0–34.0)
MCHC: 31 g/dL (ref 30.0–36.0)
MCHC: 32 g/dL (ref 30.0–36.0)
MCV: 86.1 fL (ref 78.0–100.0)
MCV: 83.1 fL (ref 78.0–100.0)
PLATELETS: 187 10*3/uL (ref 150–400)
Platelets: 213 10*3/uL (ref 150–400)
RBC: 4.38 MIL/uL (ref 4.22–5.81)
RBC: 4.67 MIL/uL (ref 4.22–5.81)
RDW: 14.9 % (ref 11.5–15.5)
RDW: 15 % (ref 11.5–15.5)
WBC: 16.8 10*3/uL — AB (ref 4.0–10.5)
WBC: 17.2 10*3/uL — ABNORMAL HIGH (ref 4.0–10.5)

## 2015-01-29 LAB — I-STAT CHEM 8, ED
BUN: 23 mg/dL (ref 6–23)
CALCIUM ION: 1.09 mmol/L — AB (ref 1.13–1.30)
Chloride: 105 mmol/L (ref 96–112)
Creatinine, Ser: 1.7 mg/dL — ABNORMAL HIGH (ref 0.50–1.35)
GLUCOSE: 326 mg/dL — AB (ref 70–99)
HCT: 38 % — ABNORMAL LOW (ref 39.0–52.0)
Hemoglobin: 12.9 g/dL — ABNORMAL LOW (ref 13.0–17.0)
Potassium: 3.9 mmol/L (ref 3.5–5.1)
Sodium: 140 mmol/L (ref 135–145)
TCO2: 17 mmol/L (ref 0–100)

## 2015-01-29 LAB — I-STAT TROPONIN, ED: TROPONIN I, POC: 0.42 ng/mL — AB (ref 0.00–0.08)

## 2015-01-29 LAB — GLUCOSE, CAPILLARY: Glucose-Capillary: 263 mg/dL — ABNORMAL HIGH (ref 70–99)

## 2015-01-29 LAB — TROPONIN I
TROPONIN I: 10.34 ng/mL — AB (ref <0.031)
TROPONIN I: 9.72 ng/mL — AB (ref <0.031)
Troponin I: 0.58 ng/mL (ref <0.031)

## 2015-01-29 LAB — PROTIME-INR
INR: 1.3 (ref 0.00–1.49)
INR: 8.94 — AB (ref 0.00–1.49)
INR: 3.26 — ABNORMAL HIGH (ref 0.00–1.49)
Prothrombin Time: 16.4 seconds — ABNORMAL HIGH (ref 11.6–15.2)
Prothrombin Time: 73.6 seconds — ABNORMAL HIGH (ref 11.6–15.2)
Prothrombin Time: 33.5 seconds — ABNORMAL HIGH (ref 11.6–15.2)

## 2015-01-29 LAB — LIPID PANEL
CHOLESTEROL: 124 mg/dL (ref 0–200)
HDL: 29 mg/dL — AB (ref >39)
LDL Cholesterol: 48 mg/dL (ref 0–99)
Total CHOL/HDL Ratio: 4.3 RATIO
Triglycerides: 237 mg/dL — ABNORMAL HIGH (ref <150)
VLDL: 47 mg/dL — ABNORMAL HIGH (ref 0–40)

## 2015-01-29 LAB — APTT: APTT: 99 s — AB (ref 24–37)

## 2015-01-29 SURGERY — LEFT HEART CATHETERIZATION WITH CORONARY ANGIOGRAM

## 2015-01-29 MED ORDER — SODIUM CHLORIDE 0.9 % IJ SOLN
3.0000 mL | Freq: Two times a day (BID) | INTRAMUSCULAR | Status: DC
  Administered 2015-01-29 – 2015-01-31 (×3): 3 mL via INTRAVENOUS

## 2015-01-29 MED ORDER — HEPARIN SODIUM (PORCINE) 5000 UNIT/ML IJ SOLN: 5000.0000 [IU] | Freq: Three times a day (TID) | INTRAMUSCULAR | Status: DC

## 2015-01-29 MED ORDER — NOREPINEPHRINE BITARTRATE 1 MG/ML IV SOLN
0.0000 ug/min | INTRAVENOUS | Status: DC
  Administered 2015-01-29: 40 ug/min via INTRAVENOUS
  Administered 2015-01-30: 34 ug/min via INTRAVENOUS
  Filled 2015-01-29 (×3): qty 16

## 2015-01-29 MED ORDER — SODIUM BICARBONATE 8.4 % IV SOLN
INTRAVENOUS | Status: AC
Start: 2015-01-29 — End: 2015-01-29
  Filled 2015-01-29: qty 50

## 2015-01-29 MED ORDER — SODIUM CHLORIDE 0.9 % IV SOLN: 250.0000 mL | INTRAVENOUS | Status: DC | PRN

## 2015-01-29 MED ORDER — FUROSEMIDE 10 MG/ML IJ SOLN
40.0000 mg | Freq: Once | INTRAMUSCULAR | Status: AC
  Administered 2015-01-29: 40 mg via INTRAVENOUS
  Filled 2015-01-29: qty 4

## 2015-01-29 MED ORDER — SODIUM CHLORIDE 0.9 % IV SOLN
INTRAVENOUS | Status: AC
  Administered 2015-01-29: 21:00:00 via INTRAVENOUS

## 2015-01-29 MED ORDER — ACETAMINOPHEN 325 MG PO TABS: 650.0000 mg | ORAL_TABLET | ORAL | Status: DC | PRN

## 2015-01-29 MED ORDER — ROCURONIUM BROMIDE 50 MG/5ML IV SOLN
INTRAVENOUS | Status: DC | PRN
  Administered 2015-01-29: 100 mg via INTRAVENOUS

## 2015-01-29 MED ORDER — SODIUM CHLORIDE 0.9 % IV SOLN: 0.2500 mg/kg/h | INTRAVENOUS | Status: DC

## 2015-01-29 MED ORDER — PANTOPRAZOLE SODIUM 40 MG IV SOLR: 40.0000 mg | INTRAVENOUS | Status: DC

## 2015-01-29 MED ORDER — NITROGLYCERIN 1 MG/10 ML FOR IR/CATH LAB
INTRA_ARTERIAL | Status: AC
  Filled 2015-01-29: qty 10

## 2015-01-29 MED ORDER — ETOMIDATE 2 MG/ML IV SOLN
INTRAVENOUS | Status: DC | PRN
  Administered 2015-01-29: 20 mg via INTRAVENOUS

## 2015-01-29 MED ORDER — BIVALIRUDIN 250 MG IV SOLR
0.2500 mg/kg/h | INTRAVENOUS | Status: DC
  Filled 2015-01-29: qty 250

## 2015-01-29 MED ORDER — HEPARIN SODIUM (PORCINE) 1000 UNIT/ML IJ SOLN
INTRAMUSCULAR | Status: AC
  Filled 2015-01-29: qty 1

## 2015-01-29 MED ORDER — ASPIRIN 81 MG PO CHEW: 81.0000 mg | CHEWABLE_TABLET | Freq: Every day | ORAL | Status: DC

## 2015-01-29 MED ORDER — SODIUM CHLORIDE 0.9 % IV SOLN: 4.0000 ug/kg/min | INTRAVENOUS | Status: DC

## 2015-01-29 MED ORDER — SODIUM CHLORIDE 0.9 % IV SOLN
1.0000 mg/h | INTRAVENOUS | Status: DC
  Administered 2015-01-29: 2 mg/h via INTRAVENOUS
  Filled 2015-01-29: qty 10

## 2015-01-29 MED ORDER — AMIODARONE HCL IN DEXTROSE 360-4.14 MG/200ML-% IV SOLN
30.0000 mg/h | INTRAVENOUS | Status: DC
  Administered 2015-01-30 – 2015-01-31 (×4): 30 mg/h via INTRAVENOUS
  Filled 2015-01-29 (×9): qty 200

## 2015-01-29 MED ORDER — DEXTROSE 5 % IV SOLN
0.0000 ug/min | INTRAVENOUS | Status: DC
  Administered 2015-01-29: 10 ug/min via INTRAVENOUS
  Filled 2015-01-29: qty 4

## 2015-01-29 MED ORDER — BIVALIRUDIN 250 MG IV SOLR
INTRAVENOUS | Status: AC
  Filled 2015-01-29: qty 250

## 2015-01-29 MED ORDER — AMIODARONE HCL 150 MG/3ML IV SOLN
INTRAVENOUS | Status: AC
  Filled 2015-01-29: qty 3

## 2015-01-29 MED ORDER — MIDAZOLAM BOLUS VIA INFUSION
1.0000 mg | INTRAVENOUS | Status: DC | PRN
  Filled 2015-01-29: qty 1

## 2015-01-29 MED ORDER — SODIUM CHLORIDE 0.9 % IV SOLN
INTRAVENOUS | Status: DC
  Administered 2015-01-29: 2.3 [IU]/h via INTRAVENOUS
  Administered 2015-01-30: 18:00:00 via INTRAVENOUS
  Filled 2015-01-29 (×2): qty 2.5

## 2015-01-29 MED ORDER — ONDANSETRON HCL 4 MG/2ML IJ SOLN: 4.0000 mg | Freq: Four times a day (QID) | INTRAMUSCULAR | Status: DC | PRN

## 2015-01-29 MED ORDER — INSULIN ASPART 100 UNIT/ML ~~LOC~~ SOLN: 2.0000 [IU] | SUBCUTANEOUS | Status: DC

## 2015-01-29 MED ORDER — TICAGRELOR 90 MG PO TABS
90.0000 mg | ORAL_TABLET | Freq: Two times a day (BID) | ORAL | Status: DC
  Administered 2015-01-30 – 2015-01-31 (×3): 90 mg via ORAL
  Filled 2015-01-29 (×4): qty 1

## 2015-01-29 MED ORDER — FENTANYL CITRATE 0.05 MG/ML IJ SOLN
25.0000 ug/h | INTRAMUSCULAR | Status: DC
  Administered 2015-01-29 – 2015-01-31 (×3): 150 ug/h via INTRAVENOUS
  Filled 2015-01-29 (×4): qty 50

## 2015-01-29 MED ORDER — TICAGRELOR 90 MG PO TABS
180.0000 mg | ORAL_TABLET | Freq: Once | ORAL | Status: AC
  Administered 2015-01-29: 180 mg
  Filled 2015-01-29: qty 2

## 2015-01-29 MED ORDER — SODIUM BICARBONATE 8.4 % IV SOLN
INTRAVENOUS | Status: AC
  Filled 2015-01-29: qty 100

## 2015-01-29 MED ORDER — AMIODARONE LOAD VIA INFUSION
150.0000 mg | Freq: Once | INTRAVENOUS | Status: AC
  Administered 2015-01-29: 150 mg via INTRAVENOUS
  Filled 2015-01-29: qty 83.34

## 2015-01-29 MED ORDER — SODIUM CHLORIDE 0.9 % IV SOLN
1.0000 mg/h | INTRAVENOUS | Status: DC
  Administered 2015-01-30: 2 mg/h via INTRAVENOUS
  Filled 2015-01-29 (×3): qty 10

## 2015-01-29 MED ORDER — BIVALIRUDIN BOLUS VIA INFUSION: 0.1000 mg/kg | Freq: Once | INTRAVENOUS | Status: DC

## 2015-01-29 MED ORDER — CANGRELOR TETRASODIUM 50 MG IV SOLR
INTRAVENOUS | Status: AC
  Filled 2015-01-29: qty 50

## 2015-01-29 MED ORDER — MIDAZOLAM HCL 2 MG/2ML IJ SOLN
1.0000 mg | Freq: Once | INTRAMUSCULAR | Status: AC
Start: 2015-01-29 — End: 2015-01-29
  Administered 2015-01-29: 1 mg via INTRAVENOUS

## 2015-01-29 MED ORDER — SODIUM CHLORIDE 0.9 % IV SOLN
1.7500 mg/kg/h | INTRAVENOUS | Status: AC
  Filled 2015-01-29: qty 250

## 2015-01-29 MED ORDER — ASPIRIN 300 MG RE SUPP
300.0000 mg | Freq: Once | RECTAL | Status: DC
  Filled 2015-01-29 (×2): qty 1

## 2015-01-29 MED ORDER — NOREPINEPHRINE BITARTRATE 1 MG/ML IV SOLN
0.0000 ug/min | INTRAVENOUS | Status: DC
  Filled 2015-01-29: qty 4

## 2015-01-29 MED ORDER — LIDOCAINE HCL (PF) 1 % IJ SOLN
INTRAMUSCULAR | Status: AC
  Filled 2015-01-29: qty 30

## 2015-01-29 MED ORDER — CHLOROPROCAINE HCL 1 % IJ SOLN
30.0000 mL | INTRAMUSCULAR | Status: AC
  Administered 2015-01-29: 30 mL
  Filled 2015-01-29: qty 30

## 2015-01-29 MED ORDER — PANTOPRAZOLE SODIUM 40 MG IV SOLR
40.0000 mg | Freq: Every day | INTRAVENOUS | Status: DC
  Administered 2015-01-29: 40 mg via INTRAVENOUS
  Filled 2015-01-29 (×2): qty 40

## 2015-01-29 MED ORDER — EPINEPHRINE HCL 0.1 MG/ML IJ SOSY
PREFILLED_SYRINGE | INTRAMUSCULAR | Status: DC | PRN
  Administered 2015-01-29: 0.5 mg via INTRAVENOUS

## 2015-01-29 MED ORDER — SODIUM CHLORIDE 0.9 % IV SOLN
1.0000 ug/kg/min | INTRAVENOUS | Status: DC
  Administered 2015-01-29: 1 ug/kg/min via INTRAVENOUS
  Filled 2015-01-29: qty 20

## 2015-01-29 MED ORDER — AMIODARONE HCL IN DEXTROSE 360-4.14 MG/200ML-% IV SOLN
60.0000 mg/h | INTRAVENOUS | Status: AC
Start: 2015-01-29 — End: 2015-01-30
  Administered 2015-01-29 – 2015-01-30 (×2): 60 mg/h via INTRAVENOUS
  Filled 2015-01-29: qty 200

## 2015-01-29 MED ORDER — FENTANYL BOLUS VIA INFUSION
25.0000 ug | INTRAVENOUS | Status: DC | PRN
  Filled 2015-01-29: qty 25

## 2015-01-29 MED ORDER — SODIUM CHLORIDE 0.9 % IV SOLN: 2000.0000 mL | Freq: Once | INTRAVENOUS | Status: DC

## 2015-01-29 MED ORDER — SODIUM CHLORIDE 0.9 % IJ SOLN: 3.0000 mL | INTRAMUSCULAR | Status: DC | PRN

## 2015-01-29 MED ORDER — HEPARIN (PORCINE) IN NACL 2-0.9 UNIT/ML-% IJ SOLN
INTRAMUSCULAR | Status: AC
  Filled 2015-01-29: qty 1000

## 2015-01-29 MED ORDER — FENTANYL CITRATE 0.05 MG/ML IJ SOLN
50.0000 ug | Freq: Once | INTRAMUSCULAR | Status: AC
  Administered 2015-01-29: 50 ug via INTRAVENOUS

## 2015-01-29 MED ORDER — ARTIFICIAL TEARS OP OINT
1.0000 "application " | TOPICAL_OINTMENT | Freq: Three times a day (TID) | OPHTHALMIC | Status: DC
  Administered 2015-01-29 – 2015-01-30 (×3): 1 via OPHTHALMIC
  Filled 2015-01-29: qty 3.5

## 2015-01-29 NOTE — Progress Notes (Signed)
eLink Physician-Brief Progress Note Patient Name: PAXTEN APPELT DOB: 11/16/1922 MRN: [id number]   Date of Service  01/22/2015  HPI/Events of Note  CVL has been re-positioned  eICU Interventions  Plan: CXR for CVL position  Will not obtain AM PCXR after this     Intervention Category Minor Interventions: Clinical assessment - ordering diagnostic tests  Glendell Schlottman 01/14/2015, 11:30 PM

## 2015-01-29 NOTE — ED Notes (Signed)
Witnessed arrest by wife. Pt had agonal breathing then slumped over, fire started CPR. Shocked once at 200 jules. Regained pulses, lost pulses, and regained pulses with EMS in CPR. Pt down approx 4-5 without CPR. Pt was coded approx 20 minutes. Total of 4 epi were given, epi drip intiated by EMS. NS bolus running 1883mL received. (Pt received 1 L cold saline). EMS reports EKG shows interior septal infarct. BP 110/68, HR 90 paced on demand rhythm.CBG 206. Pt has IO and was bag masked by EMS. EMS reports strong radial/carotid pulses

## 2015-01-29 NOTE — Procedures (Signed)
Arterial Catheter Insertion Procedure Note JAEGER TRUEHEART [id number] 02-14-1923  Procedure: Insertion of Arterial Catheter  Indications: Blood pressure monitoring  Procedure Details Consent: Risks of procedure as well as the alternatives and risks of each were explained to the (patient/caregiver).  Consent for procedure obtained. and Unable to obtain consent because of emergent medical necessity. Time Out: Verified patient identification, verified procedure, site/side was marked, verified correct patient position, special equipment/implants available, medications/allergies/relevent history reviewed, required imaging and test results available.  Performed  Maximum sterile technique was used including antiseptics, cap, gloves, gown, hand hygiene, mask and sheet. Skin prep: Chlorhexidine; local anesthetic administered 20 gauge catheter was inserted into right radial artery using the Seldinger technique.  Evaluation Blood flow good; BP tracing good. Complications: No apparent complications.   Ulice Dash 01/16/2015

## 2015-01-29 NOTE — Progress Notes (Signed)
Chaplain responded to page for family needing to be escorted to cath lab waiting. Family already in [facility] waiting. Chaplain introduced herself and offered to keep family in her prayers. Prayers appreciated by pt wife, Jeremy Mcdaniel. Page chaplain as needed.  [name], Epifanio Lesches 01/09/2015 7:29 PM

## 2015-01-29 NOTE — CV Procedure (Signed)
CARDIAC CATHETERIZATION AND PERCUTANEOUS CORONARY INTERVENTION REPORT  NAME:  Jeremy Mcdaniel   MRN: [id number] DOB:  20-Feb-1923   ADMIT DATE: 01/24/2015 Procedure Date: 01/21/2015  INTERVENTIONAL CARDIOLOGIST: Leonie Man, M.D., MS PRIMARY CARE PROVIDER: Jilda Panda, MD PRIMARY CARDIOLOGIST: Leonie Man, MD, [place]  Scub Tech: Burman Foster.  PATIENT:  Jeremy Mcdaniel 81 y.o. male with long standing CML & DM-2 who was in [place] until after planting tomatoes in the garden today. He told his wife that he did not feel well. Shortly after this, he started agonal breathin & slumped over. 911 called - EMS (Fire arrived relatively rapidly - Defib x 1 with ROSC) --> short run of CPR (automted CPR machine used). Became bradycardic with difficult to assess QRS complexes, but concerning for STEMI --> Code STEMI was called. He was Transcutaneously pacing @ ~80 bpm upon arrival. Was unresonsive.  In ER , after initial assessment -> intubation with standard ETT performed & patient was brought to the cardiac cath lab. I personally met with the family (wife & oldest son) & confirmed their desire to proceed with cardiac cath. Emergent consent presumed & confirmed by family. The patient was in a very erratic cardiac rhythm with intermittent runs of non-sustained ventricular tachycardia as well as sinus rhythm with multiple PACs and PVCs, but there also is an appearance of an irregular rhythm such as atrial fibrillation.  There is assistant delay as the patient presented to the emergency room and required intubation with a standard ET tube.  PRE-OPERATIVE DIAGNOSIS:    Anterior STEMI  Cardiac Arrest Status Post defibrillation with return of spontaneous rhythm  Cardiogenic Shock  Temporary bradycardia requiring intermittent transcutaneous pacing  PROCEDURES PERFORMED:    LEFT HEART CATHETERIZATION WITH CORONARY ANGIOGRAM (N/A)   PERCUTANEOUS CORONARY INTERVENTION OF PROXIMAL LAD 100%  OCCLUSION -  PROMUS PREMIER DES STENT 3.5 MM X 20 MM ( ~3.9 - 4.0 MM)  DEFIBRILLATION X 2   PLACEMENT OF TEMPORARY PACEMAKER IN RV  PROCEDURE: The patient was brought to the 2nd [place] [facility] following endotracheal intubation. He was prepped and draped in the usual sterile fashion for RIGHT COMMON FEMORAL ARTERY & VENOUS access. Sterile technique was used including antiseptics, cap, gloves, gown, hand hygiene, mask and sheet. Skin prep: Chlorhexidine. Arrival to cath lab: 1724 hours  Consent: Risks of procedure as well as the alternatives and risks of each were explained to the (patient/caregiver). Emergency consent was assumed for procedure.   After discussion with the patient's family, tit would appear to be the patient wished to proceed.  Time Out: Verified patient identification, verified procedure, site/side was marked, verified correct patient position, special equipment/implants available, medications/allergies/relevent history reviewed, required imaging and test results available. Performed.  Access:  Right Common Femoral Artery: 6 Fr Sheath - fluoroscopically guided modified Seldinger Technique  Right Common Femoral Vein: 7 Fr Sheath - modified Seldinger Technique.  Left Heart Catheterization: 5 & 6 Fr Catheters advanced or exchanged over a standard J-wire under direct fluoroscopic guidance.; 5 [place] JR 4 catheter advanced first.  With fluoroscopy it appeared that the patient's tobacco abdominal and abdominal iliac aorta is extremely tortuous and catheter manipulation wasn't possible. At this point the JR 4 catheter was removed and the 6 [place] short sheath was exchanged for a 6 [place] long 45 cm Pinnacle Destination Sheath that was advanced under fluoroscopic guidance into the mid abdominal aorta.  After multiple different catheters were used, finally 2 catheters successfully engaged  the right and left coronary arteries as follows: Right Coronary  Artery Cineangiography: 5 Fr AL1 Catheter  Right Coronary Artery, SVG-RCA & SVG-OM Cineangiography: 6 Fr XBLAD 4.0  Guide Catheter   LV Hemodynamics:  JR 4 (LV Gram was not performed due to extensive ectopy.  Another reason for non-system delay in the door to balloon time was the patient's extreme tortuosity in the Toraco-abdominal Aorta and iliacs that required sheath exchange , along with multiple different catheters prior to successful cannulation of the coronary arteries.  FINDINGS:  Hemodynamics:   Central Aortic Pressure / Mean:  114/68/89 mmHg; on 20 mcg/kilogram/minute of norepinephrine  Left Ventricular Pressure / LVEDP: 116/19/32 mmHg  Left Ventriculography:  Deferred  Coronary Anatomy:  Dominance: Co-domninat   Left Main: Large caliber vessel that Trifurcates distally in to the LAD, Ramus Intermedius, and Circumflex.  Angiogrphically normal. LAD: Large caliber vessel that is 163% atheroembolically occluded in the Proximal segment.  Post PTCA angiography revealed a large-caliber LAD gives rise to a diagonal branch just after the occlusion site. There are several major septal perforators. The LAD itself is a very tortuous vessel and distal portion as it reaches down to the apex reason the infero-apex.  D1: moderately large caliber, very tortuous vessel that bifurcates proximally. Angiographically normal.  Left Circumflex: large caliber, codominant vessel it gives rise to a moderate caliber first marginal branch in the mid vessel and terminates as a large lateral/posterolateral branch and a smaller second posterior lateral branch. Angiographically normal.  OM1: moderate caliber, tortuous vessel. Angiographically normal  OM 2: moderate to large caliber vessel that is the main branch of the circumflex. This vessel courses is really a posterolateral branch  Ramus intermedius: Very large caliber vessel that bifurcates in the mid vessel in to 2 moderate-large caliber branches.  The  inferior branch bifurcates distally.   RCA: Moderate caliber, non-dominant vessel that terminates distally as the Right Posterior Descending Artery very minimal right posterolateral system.  There is a proximal/near ostial 40% lesion in the RCA, otherwise minimal luminal irregularities.  After reviewing the initial angiography, the culprit lesion was thought to be the100% thrombotic LAD occlusion.  Preparation were made to proceed with PCI on this lesion.  The patient was administered a second and of bicarbonate for combined metabolic and respiratory acidosis at this time. Also a neuroma also been ordered.  Percutaneous Coronary Intervention:  Sheath exchanged for 6 Fr Guide: 6 Fr   XB LAD 4.0 Guidewire: Cougar Predilation Balloon: Emerge 2.5 mm x 12 mm;   12 Atm x 30 Sec,  X 3 inflations across the occlusive lesion (appears post thrombus to be ~80-90% stenosed.  1st Balloon - 1805 Stent: Promus Premier 3.5 mm x 20 mm;   Deployed at 14 Atm x 20 Sec,   15 seconds and the balloon inflation the stent, the patient became, hypotensive and his rhythm had converted to ventricular tachycardia. -- the patient was successfully defibrillated with one shock at 200 J. The amiodarone bolus arrived and was administered.  During post deployment angiography the patient had another run of sustained ventricular tachycardia requiring defibrillation. At this time a second intervals was administered and a drip started.  Unfortunately, as the patient had the ventricular tachycardia that required defibrillation, the stent balloon was pulled back to allow for safe cardioversion. It then became very difficult to pass balloon catheters past the midportion of the stent.   X. Several attempts to pass aNC Euphora 4.0 mm x 15 mm balloon were unsuccessful,  this was exchanged for an 8 mm length [place] Euphora 4.0 mm balloon. This was able to pass into the first third of the stent and was used to post dilate the proximal portion of  the stent.   Post-dilation Balloon: [place] Euphora 4.0 mm x 8 mm; proximal 1/3 of stent  10 Atm x 30 Sec; Final Diameter - 4.0 mm   Post-dilation Balloon: Euphora 4.0 mm x 12 mm; Compliant balloon used to advance beyond the mid stent segment  12 Atm x 30 Sec, 3 inflations - distal to proximal  Final Diameter; ~3.9 mm  Initial post angioplasty therapy revealed possible step down versus hazy distal stent age inferiorly. After briefly, the second wire was removed and another angiographic images obtained revealing resolution of the distal edge segment of concern.  Post deployment angiography in multiple views, with and without guidewire in place revealed excellent stent deployment and lesion coverage.  There was no evidence of dissection or perforation.  MEDICATIONS:  Anesthesia:  Local Lidocaine 14 ml  Sedation:no sedation was given. The patient was given medications for intubation  Premedication: 4000 units of IV heparin was administered upon arrival to the cath lab. This has been ordered in the ER, but had not yet been administered.  Omnipaque Contrast: 230 ml  Anticoagulation:  IV Heparin 4000 units Units ; Angiomax Bolus & drip -- d/c upon arrival to CCU.  Anti-Platelet Agent:  Cangrelor Bolus & drip - according to protocol --- d/c after 2 hr  PATIENT DISPOSITION:    The patient was transferred to the CCU a hemodynamicaly stable on 20 mcg/Kg/min.  He continued to be minimally responsive.  The patient tolerated the procedure well, and there were no complications.  EBL:   < 40 ml  The patient was stable before, during, and after the procedure.  POST-OPERATIVE DIAGNOSIS:    SEVERE SINGLE VESSEL CAD WITH PROXIMAL LAD 100% THROMBOTIC OCCLUSION  DIFFICULT, SUCCESSFUL PCI OF PROX LAD = PROMUS PREMIER DES 3.5 MM X 20 MM (POST DILATED TO ~3.9 MM)  CARDIOGENIC SHOCK -- levophed being weaned to off upon arrival to CCU.  VENTRICULAR TACHYCARDIA/FIBRILATION ARREST - S/P DEFIBRILLATION  X 2 IN CATH LAB  Combined Metabolic / Respiratory Acidosis  Extremely tortuous Aorta  Severely elevated LVEDP despite systemic hypotension  PLAN OF CARE:  Admit to CCU with PCCM assistance for Artic Sun Cooling protocol & Ventilator management  OK to use Fem-stop if necessary for hematoma  D/c Cangrelor & Angiomax as noted above  Place OG Tube for administration of crushed Brilinta 180 mg along with ASA 324 mg.  Check 2 D Echo  Continue IV amiodarone bolus.  IV Lasix for elevated LVEDP  Wean norepinephrine as tolerated while on the Artic Sun Cooling Protocol   The patient's family was informed of the serious nature of his condition, but with well placed stent.   Leonie Man, M.D., M.S. Interventional Cardiologist   Pager # [phone]

## 2015-01-29 NOTE — H&P (Signed)
Patient ID: Jeremy Mcdaniel MRN: [id number], DOB/AGE: 79-23-1924   Admit date: 01/15/2015  Primary Physician: Jilda Panda, MD Primary Cardiologist: new - seen by D. Ellyn Hack, MD   Pt. Profile:  81 y/o male w/o prior cardiac hx who presented to the ED today following witnessed cardiac arrest with WCT and Anterior ST elevation.  Problem List  Past Medical History  Diagnosis Date  . Hypertension   . Hypercholesterolemia   . Hernia   . Hypercholesterolemia   . Leg cramps     at night   . CLL (chronic lymphocytic leukemia)   . Diastolic dysfunction 35/32/9924    Grade 2. Ejection fraction 60%.  Marland Kitchen TIA (transient ischemic attack) 10/21/2014  . Diabetes mellitus type II, controlled, with no complications     Diet-controlled    Past Surgical History  Procedure Laterality Date  . Hemorrhoid surgery    . Hernia repair      laparoscopic ventral -[facility]. Total of 4 hernia surgeries  . Cataract extraction w/phaco  05/16/2012    Procedure: CATARACT EXTRACTION PHACO AND INTRAOCULAR LENS PLACEMENT (IOC);  Surgeon: Tonny Branch, MD;  Location: [facility];  Service: Ophthalmology;  Laterality: Left;  CDE:17.23  . Cataract extraction w/phaco  07/14/2012    Procedure: CATARACT EXTRACTION PHACO AND INTRAOCULAR LENS PLACEMENT (IOC);  Surgeon: Tonny Branch, MD;  Location: [facility];  Service: Ophthalmology;  Laterality: Right;  CDE=15.85     Allergies  Allergies  Allergen Reactions  . Anesthetics, Amide Rash    Had medicine to put him to sleep for hernia repair -and he broke out in rash . He got benadryl for it  . Azulfidine [Sulfasalazine] Rash  . Codeine Rash  . Penicillins Rash  . Sulfa Antibiotics Rash    HPI  81 y/o male without a prior cardiac hx.  He does have a h/o CLL, HTN, HL, DM, and TIA.  Per records, he was in his usual state of health until earlier today when he had a witnessed arrest and became unresponsive.  EMS was called and he required CPR and defibrillation x 2 with ROSC.   ECG revealed wide complex rhythm with ST changes concerning for antlat STEMI.  Code STEMI was activated and pt was taken to the ED where he required intubation.  He was evaluated by cardiology and was then taken to the cath lab for emergent diagnostic catheterization.    Home Medications  Prior to Admission medications   Medication Sig Start Date End Date Taking? Authorizing Provider  aspirin EC 81 MG tablet Take 2 tablets (162 mg total) by mouth daily. 10/21/14   Rexene Alberts, MD  Cinnamon 500 MG TABS Take 1 tablet by mouth every morning.     Historical Provider, MD  lisinopril (PRINIVIL,ZESTRIL) 10 MG tablet Take 10 mg by mouth daily.    Historical Provider, MD  simvastatin (ZOCOR) 20 MG tablet Take 10 mg by mouth daily.     Historical Provider, MD  traMADol (ULTRAM) 50 MG tablet Take 50 mg by mouth every 6 (six) hours as needed for moderate pain.    Historical Provider, MD   Family History  Family History  Problem Relation Age of Onset  . Other      unable to obtain 2/2 intubated status.    Social History - unable to obtain 2/2 intubated status.  History   Social History  . Marital Status: Married    Spouse Name: N/A  . Number of Children: N/A  .  Years of Education: N/A   Occupational History  . Not on file.   Social History Main Topics  . Smoking status: Never Smoker   . Smokeless tobacco: Not on file  . Alcohol Use: No  . Drug Use: No  . Sexual Activity: Yes    Birth Control/ Protection: None   Other Topics Concern  . Not on file   Social History Narrative     Review of Systems - unable to obtain 2/2 intubated status.  Physical Exam  Blood pressure 97/68, pulse 98, resp. rate 20, height 5\' 8"  (1.727 m), SpO2 100 %.  General: Intubated, sedated. Psych: Intubated, sedated. Neuro: Intubated, sedated. HEENT: Normal  Neck: Supple without bruits.  Obese, difficult to assess jvp. Lungs:  Resp regular and unlabored, scattered rhonchi throughout. Heart: RRR no  s3, s4, or murmurs. Abdomen: Soft, non-tender, non-distended, BS + x 4.  Extremities: No clubbing, cyanosis.  Trace bilat LE edema. DP/PT/Radials 2+ and equal bilaterally.  Labs  Troponin [facility] Test)  Recent Labs  01/08/2015 1711  TROPIPOC 0.42*    Recent Labs  01/12/2015 1730  CKTOTAL 207  CKMB 17.0*  TROPONINI 0.58*   Lab Results  Component Value Date   WBC 16.8* 01/17/2015   HGB 11.7* 01/13/2015   HCT 37.7* 01/15/2015   MCV 86.1 02/03/2015   PLT 187 01/09/2015    Recent Labs Lab 01/26/2015 1730  NA 140  K 4.4  CL 108  CO2 20  BUN 20  CREATININE 1.89*  CALCIUM 7.9*  PROT 5.2*  BILITOT 0.5  ALKPHOS 119*  ALT 112*  AST 189*  GLUCOSE 322*   Lab Results  Component Value Date   CHOL 124 01/18/2015   HDL 29* 01/30/2015   LDLCALC 48 01/12/2015   TRIG 237* 01/10/2015    Radiology/Studies  No results found.  ECG  Indeterminate wide complex rhythm with anterior ST elevation, 73.  ASSESSMENT AND PLAN  1.  Acute anterior STEMI/CAD:  S/p PCI of the LAD.  Admit to CCU.  Follow-up echo.  Cont asa, brilinta.  No bb 2/2 hypotn.  Plan to add statin once taking PO's.  Eventual cardiac rehab.  Elevated LVEDP - IV Lasix in CCU x 1 if BP is stable.  2.  VT/VF Arrest: s/p defib x 2 in the field and 2 additional shocks during cath.  Cont amiodarone IV load; TPM in place in case of bradycardia. * PCCM to assist with Vent management & Acidosis  3.  Hypotension/cardiogenic shock:  On norepi.  Follow.  Echo pending.  Watch for volume overload.  No bb/acei/arb/arni @ this time 2/2 hypotension. * upon arrival to CCU - hope to wean Norepi.  4.  HTN:  See #3.  Follow.  5.  HL:  Check lipids.  LFT's elevated in setting of MI and cgs.  Plan to add statin as numbers improve.  6.  DM:  Add ssi.  7.  CLL:  Follow.   Signed, Murray Hodgkins, NP 01/27/2015, 8:00 PM'  ATTENDING ATTESTATION:  I personally saw & evaluated the patient upon arrival to the ER.  A  usually vibrant & active ~81 y/o man with PMH of CLL & DM with witnessed Cardiac Arrest & ROSC after short CPR & 1 Shock.  EKG with concerning STE segments - in both Inferior & anterior walls. --> Taken for cardiac cath - PCI to 100% prox LAD - VT x 2 with Defib - Amiodarone bolus x 2 with gtt.  Elevated  LVEDP despite cardiogenic shock.  Principal Problem:   ST elevation myocardial infarction (STEMI) of anterior wall, initial episode of care Active Problems:   Cardiac arrest - in setting of Anterior STEMI   CAD S/P PCI to Prox LAD Promus Premier DES 3.5 mm x 20 mm (~3.9 mm)   Cardiogenic shock  I have reviewed the note above &discussed findings & plan with Mr. Riki Altes. I agree with the H&P note with Impression & Plan.   Leonie Man, M.D., M.S. Interventional Cardiologist   Pager # [phone]

## 2015-01-29 NOTE — ED Provider Notes (Signed)
CSN: [id number]     Arrival date & time 02/04/2015  1656 History   First MD Initiated Contact with Patient 01/25/2015 1716     Chief Complaint  Patient presents with  . Cardiac Arrest     (Consider location/radiation/quality/duration/timing/severity/associated sxs/prior Treatment) HPI Comments: Level 5 caveat - unresponsive and post-arrest, intubated on arrival.  81 year old male with diabetes, hypertension presents with a witnessed arrest. Abdomen breathing and CPR started with EMS. He had return of spontaneous circulation twice with electrical shock as well as multiple doses of epinephrine. He presents on appendectomy drip and paced. EKG after return of spontaneous circulation showed inferior ST elevation MI. Code STEMI paged out.  Patient is a 81 y.o. male presenting with chest pain. The history is provided by the EMS personnel. History limited by: unresponsive - post arrest.  Chest Pain   Past Medical History  Diagnosis Date  . Hypertension   . Hypercholesterolemia   . Hernia   . Hypercholesterolemia   . Leg cramps     at night   . CLL (chronic lymphocytic leukemia)   . Diastolic dysfunction 20/35/5974    Grade 2. Ejection fraction 60%.  Marland Kitchen TIA (transient ischemic attack) 10/21/2014  . Diabetes mellitus type II, controlled, with no complications     Diet-controlled   Past Surgical History  Procedure Laterality Date  . Hemorrhoid surgery    . Hernia repair      laparoscopic [facility] -[facility]. Total of 4 hernia surgeries  . Cataract extraction w/phaco  05/16/2012    Procedure: CATARACT EXTRACTION PHACO AND INTRAOCULAR LENS PLACEMENT (IOC);  Surgeon: Tonny Branch, MD;  Location: [facility];  Service: Ophthalmology;  Laterality: Left;  CDE:17.23  . Cataract extraction w/phaco  07/14/2012    Procedure: CATARACT EXTRACTION PHACO AND INTRAOCULAR LENS PLACEMENT (IOC);  Surgeon: Tonny Branch, MD;  Location: [facility];  Service: Ophthalmology;  Laterality: Right;  CDE=15.85   No family history  on file. History  Substance Use Topics  . Smoking status: Never Smoker   . Smokeless tobacco: Not on file  . Alcohol Use: No    Review of Systems  Unable to perform ROS Cardiovascular: Positive for chest pain.      Allergies  Anesthetics, amide; Azulfidine; Codeine; Penicillins; and Sulfa antibiotics  Home Medications   Prior to Admission medications   Medication Sig Start Date End Date Taking? Authorizing Provider  aspirin EC 81 MG tablet Take 2 tablets (162 mg total) by mouth daily. 10/21/14   Rexene Alberts, MD  Cinnamon 500 MG TABS Take 1 tablet by mouth every morning.     Historical Provider, MD  lisinopril (PRINIVIL,ZESTRIL) 10 MG tablet Take 10 mg by mouth daily.    Historical Provider, MD  simvastatin (ZOCOR) 20 MG tablet Take 10 mg by mouth daily.     Historical Provider, MD  traMADol (ULTRAM) 50 MG tablet Take 50 mg by mouth every 6 (six) hours as needed for moderate pain.    Historical Provider, MD   BP 97/68 mmHg  Pulse 98  Resp 20  SpO2 100% Physical Exam  Constitutional: He appears well-developed.  Unresponsive, breathing, ventilations assisted on arrival, being paced  HENT:  Head: Normocephalic and atraumatic.  Mouth/Throat: Oropharynx is clear and moist.  Eyes: Pupils are equal, round, and reactive to light.  Cardiovascular:  Paced rhythm. No abnormal heart sounds. Intact distal pulses.  Pulmonary/Chest: He is in respiratory distress. He has no wheezes. He has no rales.  Bilateral breath sounds. Ventilations  being assisted. Shallow but frequent breaths. No focal breath sounds.  Abdominal: Soft. He exhibits no distension. There is no tenderness.  Musculoskeletal: Normal range of motion. He exhibits no edema or tenderness.  Neurological: He is unresponsive. No cranial nerve deficit. GCS eye subscore is 1. GCS verbal subscore is 1. GCS motor subscore is 1.  Skin: Skin is warm and dry. No rash noted. He is not diaphoretic.  Nursing note and vitals  reviewed.   ED Course  INTUBATION Date/Time: 01/18/2015 5:23 PM Performed by: Larence Penning Authorized by: Larence Penning Consent: The procedure was performed in an emergent situation. Indications: airway protection and  respiratory distress Intubation method: direct Patient status: paralyzed (RSI) Preoxygenation: BVM Pretreatment meds: epinephrine. Sedatives: etomidate Paralytic: rocuronium Laryngoscope size: Mac 4 Tube size: 7.5 mm Tube type: cuffed Number of attempts: 1 Cricoid pressure: no Cords visualized: yes Post-procedure assessment: chest rise and ETCO2 monitor Breath sounds: equal Cuff inflated: yes ETT to lip: 25 cm Tube secured with: ETT holder Chest x-ray interpreted by: ultrasound confirms tube placement. Patient tolerance: Patient tolerated the procedure well with no immediate complications   (including critical care time) Labs Review Labs Reviewed  I-STAT CHEM 8, ED - Abnormal; Notable for the following:    Creatinine, Ser 1.70 (*)    Glucose, Bld 326 (*)    Calcium, Ion 1.09 (*)    Hemoglobin 12.9 (*)    HCT 38.0 (*)    All other components within normal limits  I-STAT TROPOININ, ED - Abnormal; Notable for the following:    Troponin i, poc 0.42 (*)    All other components within normal limits    Imaging Review No results found.   EKG Interpretation None      MDM   Final diagnoses:  Cardiac arrest  ST elevation myocardial infarction (STEMI), unspecified artery  Acute respiratory failure with hypoxia   81 year old male with diabetes, hypertension presents with a witnessed arrest. Abdomen breathing and CPR started with EMS. He had return of spontaneous circulation twice with electrical shock as well as multiple doses of epinephrine. He presents on appendectomy drip and paced. EKG after return of spontaneous circulation showed inferior ST elevation MI. Code STEMI paged out.  King airway pulled prior to arrival and the patient is being bagged.  Intubated on arrival by direct laryngoscopy. End-tidal CO2, bilateral breath sounds, absent of the stomach, as well as confirmation of tube placement via ultrasound performed. Push dose epinephrine as well as etomidate and roccuronium given for intubation. Able to be transitioned off of pacing and is taking emergently to the catheter lab. Levophed hanging on admission to cath lab.    Larence Penning, MD 01/30/2015 1740  Elnora Morrison, MD 01/24/2015 2356  Elnora Morrison, MD 01/19/2015 [phone]

## 2015-01-29 NOTE — Progress Notes (Signed)
RT note-Due to ABG result increased RR to 25.

## 2015-01-29 NOTE — ED Notes (Signed)
Jeremy Mcdaniel, NT at bedside to assist with placing pt on stemi pads and transporting pt upstairs

## 2015-01-29 NOTE — Sedation Documentation (Signed)
Successful intubation 

## 2015-01-29 NOTE — Procedures (Signed)
Central Venous Catheter Insertion Procedure Note MARTINO TOMPSON [id number] 02/19/1923  Procedure: Insertion of Central Venous Catheter Indications: Assessment of intravascular volume, Drug and/or fluid administration and Frequent blood sampling  Procedure Details Consent: Unable to obtain consent because of altered level of consciousness. Time Out: Verified patient identification, verified procedure, site/side was marked, verified correct patient position, special equipment/implants available, medications/allergies/relevent history reviewed, required imaging and test results available.  Performed  Maximum sterile technique was used including antiseptics, cap, gloves, gown, hand hygiene, mask and sheet. Skin prep: Chlorhexidine; local anesthetic administered A antimicrobial bonded/coated triple lumen catheter was placed in the left internal jugular vein using the Seldinger technique.  Evaluation Blood flow good Complications: No apparent complications Patient did tolerate procedure well. Chest X-ray ordered to verify placement.  CXR: pending.  Procedure performed under direct ultrasound guidance for real time vessel cannulation.      Montey Hora, [place] [facility] Pager: [phone]  or [phone] 01/23/2015, 9:47 PM

## 2015-01-29 NOTE — Sedation Documentation (Signed)
Pt placed on zoll/ stemi pads.

## 2015-01-29 NOTE — ED Notes (Signed)
MD at bedside to intubate 

## 2015-01-29 NOTE — ED Notes (Signed)
Cardiology at bedside.

## 2015-01-29 NOTE — Progress Notes (Signed)
Pharmacy Re: Cangrelor  Spoke to Dr. Ellyn Hack, okay to d/c cangrelor when current bag is empty, since he should have received at least 2 hrs of cangrelor.  Will give ticagrelor oral dose prior to end of infusion.  Uvaldo Rising, BCPS  Clinical Pharmacist Pager [phone]  01/21/2015 8:38 PM

## 2015-01-29 NOTE — ED Notes (Signed)
EMS gave 100mg  fent. And 5mg  versed

## 2015-01-29 NOTE — ED Notes (Signed)
Pt ready for transport to cath lab.

## 2015-01-29 NOTE — ED Notes (Signed)
Dr. Reather Converse was shown the patients 0.42 I-Stat Troponin.

## 2015-01-29 NOTE — Progress Notes (Signed)
[facility] Progress Note Patient Name: Jeremy Mcdaniel DOB: [birth year] MRN: [id number]   Date of Service  01/22/2015  vn vent  eICU Interventions  ppi     Intervention Category Intermediate Interventions: Best-practice therapies (e.g. DVT, beta blocker, etc.)  Raylene Miyamoto. 01/23/2015, 8:49 PM

## 2015-01-29 NOTE — H&P (Signed)
PULMONARY / CRITICAL CARE MEDICINE   Name: Jeremy Mcdaniel MRN: [id number] DOB: 1923-05-10    ADMISSION DATE:  01/27/2015 CONSULTATION DATE:  01/14/2015  REFERRING MD :  EDP  CHIEF COMPLAINT:  Cardiac arrest  INITIAL PRESENTATION: 81 year old male suffered witnessed cardiac arrest while gardening. 4-5 mins downtime without CPR, EMS provided 20 mins ACLS including defib and epi x 4. ED EKG showed inferior ST elevation. He was taken to cath lab and had 100% LAD occlusion with PCI. Out to ICU on vent. PCCM primary.  STUDIES:  CT head 3/22 >  SIGNIFICANT EVENTS: 3/22 Cardiac arrest > to cath lab 100% LAD occlusion > PCI > to ICU on vent   HISTORY OF PRESENT ILLNESS:  81 year old male with PMH as below, which is significant for HTN, HLD, CLL, DM and CHF. He was outside at his house gardening when he suffered a witnessed cardiac arrest. Wife called EMS, 4-5 mins without CPR until their arrival. He was found to be in VF arrest. Underwent 20 mins ACLS epi x 4 and defibrillation. Total downtime estimated 25 mins. In ED he was found to have inferior ST elevation on EKG and was taken to cath lab. He had 100% LAD occlusion requiring PCI. During this he converted to VF twice, converting back to sinus with a single defibrillation each time. Post-procedurally he was taken to ICU on ventilator. PCCM primary.   PAST MEDICAL HISTORY :   has a past medical history of Hypertension; Hypercholesterolemia; Hernia; Hypercholesterolemia; Leg cramps; CLL (chronic lymphocytic leukemia); Diastolic dysfunction (27/25/3664); TIA (transient ischemic attack) (10/21/2014); and Diabetes mellitus type II, controlled, with no complications.  has past surgical history that includes Hemorrhoid surgery; Hernia repair; Cataract extraction w/PHACO (05/16/2012); and Cataract extraction w/PHACO (07/14/2012). Prior to Admission medications   Medication Sig Start Date End Date Taking? Authorizing Provider  aspirin EC 81 MG tablet Take 2  tablets (162 mg total) by mouth daily. 10/21/14   Rexene Alberts, MD  Cinnamon 500 MG TABS Take 1 tablet by mouth every morning.     Historical Provider, MD  lisinopril (PRINIVIL,ZESTRIL) 10 MG tablet Take 10 mg by mouth daily.    Historical Provider, MD  simvastatin (ZOCOR) 20 MG tablet Take 10 mg by mouth daily.     Historical Provider, MD  traMADol (ULTRAM) 50 MG tablet Take 50 mg by mouth every 6 (six) hours as needed for moderate pain.    Historical Provider, MD   Allergies  Allergen Reactions  . Anesthetics, Amide Rash    Had medicine to put him to sleep for hernia repair -and he broke out in rash . He got benadryl for it  . Azulfidine [Sulfasalazine] Rash  . Codeine Rash  . Penicillins Rash  . Sulfa Antibiotics Rash    FAMILY HISTORY:  has no family status information on file.  SOCIAL HISTORY:  reports that he has never smoked. He does not have any smokeless tobacco history on file. He reports that he does not drink alcohol or use illicit drugs.  REVIEW OF SYSTEMS:  Unable, intubated  SUBJECTIVE:   VITAL SIGNS: Pulse Rate:  [98-158] 98 (03/22 1718) Resp:  [18-21] 20 (03/22 1718) BP: (71-97)/(46-69) 97/68 mmHg (03/22 1718) SpO2:  [80 %-100 %] 100 % (03/22 1718) FiO2 (%):  [100 %] 100 % (03/22 1718) HEMODYNAMICS:   VENTILATOR SETTINGS: Vent Mode:  [-] PRVC FiO2 (%):  [100 %] 100 % Set Rate:  [20 bmp-25 bmp] 25 bmp Vt Set:  [  500 mL] 500 mL PEEP:  [5 [place] Pressure:  [21 cmH20] 21 cmH20 INTAKE / OUTPUT: No intake or output data in the 24 hours ending 01/23/2015 1947  PHYSICAL EXAMINATION: General: Elderly male, sedated, in NAD. Neuro: Sedated, does not follow commands. HEENT: [place]/AT. PERRL, sclerae anicteric. Cardiovascular: RRR, no M/R/G.  Lungs: Respirations even and unlabored.  Coarse rhonchi bilaterally. Abdomen: BS x 4, soft, NT/ND.  Musculoskeletal: No gross deformities, no edema.  Skin: Intact, warm, no rashes.   LABS:  CBC  Recent  Labs Lab 02/01/2015 1713 01/28/2015 1730  WBC  --  16.8*  HGB 12.9* 11.7*  HCT 38.0* 37.7*  PLT  --  187   Coag's  Recent Labs Lab 01/16/2015 1730  APTT 99*  INR 1.30   BMET  Recent Labs Lab 02/05/2015 1713 01/23/2015 1730  NA 140 140  K 3.9 4.4  CL 105 108  CO2  --  20  BUN 23 20  CREATININE 1.70* 1.89*  GLUCOSE 326* 322*   Electrolytes  Recent Labs Lab 01/21/2015 1730  CALCIUM 7.9*   Sepsis Markers No results for input(s): LATICACIDVEN, PROCALCITON, O2SATVEN in the last 168 hours. ABG  Recent Labs Lab 02/04/2015 1817  PHART 7.146*  PCO2ART 46.6*  PO2ART 86.0   Liver Enzymes  Recent Labs Lab 02/07/2015 1730  AST 189*  ALT 112*  ALKPHOS 119*  BILITOT 0.5  ALBUMIN 3.0*   Cardiac Enzymes  Recent Labs Lab 01/22/2015 1730  TROPONINI 0.58*   Glucose No results for input(s): GLUCAP in the last 168 hours.  Imaging No results found.   ASSESSMENT / PLAN:  PULMONARY OETT 3/22 > A: Acute hypoxic respiratory failure following cardiac arrest P:   Full vent support, 8cc/kg. VAP bundle. Hold daily SBT while undergoing hypothermia protocol. ABG and CXR in AM.  CARDIOVASCULAR CVL L IJ 3/22 (pending) >>> A:  VF/VT arrest due to 100% LAD occlusion s/p PCI 3/22 Cardiogenic shock Hx HTN, HLD, grade 2 DD (EF 60%) P:  Initiate hypothermia protocol. Continue amiodarone per cards. Levophed as needed to maintain goal MAP > 80. Goal CVP 8 - 12. Trend troponin / lactate. Echo.  RENAL A:   AKI At risk metabolic derangements during hypothermia P:   NS @ 75. Replace electrolytes as indicated. BMP q2hrs x 4.  GASTROINTESTINAL A:   Transaminitis - likely from shock liver GI prophylaxis Nutrition P:   Trend LFT's. SUP: Pantoprazole. NPO. Nutrition consult for TF's.  HEMATOLOGIC A:   Mild anemia VTE prophylaxis P:  Transfuse for Hgb < 7. SCD's / Heparin. Coags q8hrs. CBC in AM.  INFECTIOUS A:  No indication for infection P:   Monitor  clinically.  ENDOCRINE A:   DM   P:   ICU hyperglycemia protocol.  NEUROLOGIC A:   Acute metabolic encephalopathy Hx TIA P:   Sedation:  Fentanyl gtt / Midazolam gtt / Nimbex. RASS goal: - 5. Hold daily WUA while undergoing hypothermia protocol.   FAMILY  - Updates: None.  - Inter-disciplinary family meet or Palliative Care meeting due by:  3/28.   Montey Hora, [facility] Pager: [phone]  or [phone] 01/10/2015, 9:00 PM

## 2015-01-29 NOTE — Brief Op Note (Signed)
BRIEF CATH NOTE  01/10/2015  7:37 PM  PATIENT:  Jeremy Mcdaniel  81 y.o. male with long standing CML & DM-2 who was in [place] until after planting tomatoes in the garden today.  He told his wife that he did not feel well. Shortly after this, he started agonal breathin & slumped over.  911 called - EMS ([facility] arrived relatively rapidly - Defib x 1 with ROSC) --> short run of CPR (automted CPR machine used).  Became bradycardic with difficult to assess QRS complexes, but concerning for STEMI --> Code STEMI was called.  He was Transcutaneously pacing @ ~80 bpm upon arrival.  Was unresonsive.  In ER , after initial assessment -> intubation with standard ETT performed & patient was brought to the cardiac cath lab.  I personally met with the family (wife & oldest son) & confirmed their desire to proceed with cardiac cath.  Emergent consent presumed & confirmed by family.  PRE-OPERATIVE DIAGNOSIS:  ANTERIOR STEMI, CARDIAC ARREST, CARDIOGENIC SHOCK  Delayed arrival to cardiac cath lab due to need for secure airway intubation in ER, Very difficult / tortuous Aorto-Iliac & ThoracoAbdominal Aorta requiring long sheath & multiple different catheters.  POST-OPERATIVE DIAGNOSIS:    SEVERE SINGLE VESSEL CAD WITH PROXIMAL LAD 100% THROMBOTIC OCCLUSION  DIFFICULT, SUCCESSFUL PCI OF PROX LAD = PROMUS PREMIER DES 3.5 MM X 20 MM (POST DILATED TO ~3.9 MM)  CARDIOGENIC SHOCK -- levophed being weaned to off upon arrival to CCU.  VENTRICULAR TACHYCARDIA/FIBRILATION ARREST - S/P DEFIBRILLATION X 2 IN CATH LAB  Combined Metabolic / Respiratory Acidosis  Extremely tortuous Aorta  PROCEDURE:  Procedure(s):  LEFT HEART CATHETERIZATION WITH CORONARY ANGIOGRAM (N/A)   PERCUTANEOUS CORONARY INTERVENTION OF PROXIMAL LAD 100% OCCLUSION - DES STENT  DEFIBRILLATION X 2   PLACEMENT OF TEMPORARY PACEMAKER IN RV  SURGEON:  Surgeon(s) and Role:    * Leonie Man, MD - Primary  ANESTHESIA:   SQ (non-lidocaine  anesthetic), No sedation beyond ER meds  EBL:    ~30 ml  PROCEDURE:     RFA & RFV 6 FR SHEATHS - modified Seldinger Technique  After unsuccessful attempt at engaging the RCA - 6Fr short sheath exchanged for 45 cm Pinnacle Destination sheath  RCA Angio 5 Fr AL1 - also used for LV Hemodynamics  LCA Angio 6 Fr XBLAD 4  PCI - XBLAD 4, Cougar wire -- (for post-dilation, a BMW wire was inserted through the stent & into a diagonal branch  Emerge balloon 2.5 mm x 15 mm - 3 inflations at 12 Atm x 30 Sec -- reperfusion with 2nd inflation  Stent: Promus Premier DES - Deployed 14 Atm x 20 (during inflation, patient went into V Tach - required Shock x 1) - was in the process of 150 mg Amiodarone Bolus for severe Ectopy & Non-sustainted VT  While taking post-deployment angiography, 1 more episode of sustained VTach - Shock for 2nd time  2nd Amiodarone bolus given & drip started - with completion of 2nd bolus, the rhythm stabilized  Unable to advance 15 ml Vashon Balloon due to tortuous turns with stent --   1 inflation of Stanton Euphora 4.0 mm x 8 mm (in proximal 1/2 of stent) : 10 Atm x 30 Sec (4.0 mm)  A Euphora (compliant) 4.0 mm x 12 mm was able to advance through the stent.  12 Atm x 3 inflations - final diameter ~3.9-4.0 mm  Placement of Temporary Percutaneous Transvenous Pacemaker: 5 Fr Temp Wire advanced under  fluoroscopy into the RV with balloon inflated.  Thresholds checked. - 70 mm; Threshold 0.6 mAmp -> set @ 3 mA output, rate 56 bpm (TPM placed due to HRs dropping to 40s in cath lab - especially with Amiodarone infusion).  MEDICATIONS USED:    IV Heparin 4000 units given upon arrival to Cath lab (came up from ER)  IV Levophed infusion - increased to 20 mcg/kg/min for hypotension  IV Amiodarone 150 ml bolus x 2 followed by loading dose gtt  IV Cangrelor 30 mcg/kg/min bolus, gtt @ 4 mcg/kg/min  IV Angiomax bolus & gtt - stopped upon arrival to CCU  3 Ampules of  Bicarbonate  Omnipaque Contrast 230 mL  SPECIMEN:  Blood for ABGs & ACT x 5 (4 ABG)  SHEATHS:  6 Fr Destination Sheath - changed out for 7 Fr short sheath in RFA, then both RFA & 6 Fr RFV sheaths were sutured into place.  DICTATION: .Note written in EPIC  PLAN OF CARE: Admit to inpatient  - ICU with PCCM involved for [place] Sun Protocol & Vent management  D/c Angiomax upon arrival to CCU  Place OG Tube for medications & administer ASA + Brilinta  D/C Cangrelor after ~2 hrs total infusion (provided Brilinta given)  Expect to wean off pressors.  Continue Amiodarone infusion over night --> TPM in place if HR were to drop   PATIENT DISPOSITION:  ICU - intubated and critically ill.   Delay start of Pharmacological VTE agent (>24hrs) due to surgical blood loss or risk of bleeding: not applicable   Elizabeht Suto, Leonie Green, M.D., M.S. Interventional Cardiologist   Pager # [phone]

## 2015-01-29 NOTE — Progress Notes (Signed)
Transported patient from ED trauma C to cath lab on ventilator

## 2015-01-30 ENCOUNTER — Inpatient Hospital Stay (HOSPITAL_COMMUNITY): Payer: Medicare Other

## 2015-01-30 ENCOUNTER — Encounter (HOSPITAL_COMMUNITY): Payer: Self-pay | Admitting: Cardiology

## 2015-01-30 DIAGNOSIS — R579 Shock, unspecified: Secondary | ICD-10-CM

## 2015-01-30 DIAGNOSIS — I469 Cardiac arrest, cause unspecified: Secondary | ICD-10-CM

## 2015-01-30 DIAGNOSIS — N179 Acute kidney failure, unspecified: Secondary | ICD-10-CM

## 2015-01-30 DIAGNOSIS — J9601 Acute respiratory failure with hypoxia: Secondary | ICD-10-CM

## 2015-01-30 LAB — CARBOXYHEMOGLOBIN
CARBOXYHEMOGLOBIN: 0.4 % — AB (ref 0.5–1.5)
Carboxyhemoglobin: 0.8 % (ref 0.5–1.5)
Methemoglobin: 0.8 % (ref 0.0–1.5)
Methemoglobin: 1 % (ref 0.0–1.5)
O2 Saturation: 81.7 %
O2 Saturation: 69.8 %
TOTAL HEMOGLOBIN: 11.7 g/dL — AB (ref 13.5–18.0)
Total hemoglobin: 12.5 g/dL — ABNORMAL LOW (ref 13.5–18.0)

## 2015-01-30 LAB — BASIC METABOLIC PANEL
ANION GAP: 13 (ref 5–15)
ANION GAP: 16 — AB (ref 5–15)
ANION GAP: 13 (ref 5–15)
ANION GAP: 19 — AB (ref 5–15)
ANION GAP: 13 (ref 5–15)
ANION GAP: 18 — AB (ref 5–15)
BUN: 26 mg/dL — ABNORMAL HIGH (ref 6–23)
BUN: 27 mg/dL — AB (ref 6–23)
BUN: 27 mg/dL — ABNORMAL HIGH (ref 6–23)
BUN: 28 mg/dL — AB (ref 6–23)
BUN: 30 mg/dL — AB (ref 6–23)
BUN: 30 mg/dL — ABNORMAL HIGH (ref 6–23)
CALCIUM: 7.3 mg/dL — AB (ref 8.4–10.5)
CALCIUM: 7.2 mg/dL — AB (ref 8.4–10.5)
CALCIUM: 7.3 mg/dL — AB (ref 8.4–10.5)
CHLORIDE: 107 mmol/L (ref 96–112)
CHLORIDE: 105 mmol/L (ref 96–112)
CO2: 19 mmol/L (ref 19–32)
CO2: 20 mmol/L (ref 19–32)
CO2: 18 mmol/L — ABNORMAL LOW (ref 19–32)
CO2: 20 mmol/L (ref 19–32)
CO2: 18 mmol/L — AB (ref 19–32)
CO2: 18 mmol/L — ABNORMAL LOW (ref 19–32)
CREATININE: 1.99 mg/dL — AB (ref 0.50–1.35)
CREATININE: 2.06 mg/dL — AB (ref 0.50–1.35)
Calcium: 7.3 mg/dL — ABNORMAL LOW (ref 8.4–10.5)
Calcium: 7.3 mg/dL — ABNORMAL LOW (ref 8.4–10.5)
Calcium: 7.6 mg/dL — ABNORMAL LOW (ref 8.4–10.5)
Chloride: 104 mmol/L (ref 96–112)
Chloride: 108 mmol/L (ref 96–112)
Chloride: 110 mmol/L (ref 96–112)
Chloride: 104 mmol/L (ref 96–112)
Creatinine, Ser: 2.06 mg/dL — ABNORMAL HIGH (ref 0.50–1.35)
Creatinine, Ser: 1.93 mg/dL — ABNORMAL HIGH (ref 0.50–1.35)
Creatinine, Ser: 2.01 mg/dL — ABNORMAL HIGH (ref 0.50–1.35)
Creatinine, Ser: 2.11 mg/dL — ABNORMAL HIGH (ref 0.50–1.35)
GFR calc Af Amer: 31 mL/min — ABNORMAL LOW (ref >90)
GFR calc Af Amer: 32 mL/min — ABNORMAL LOW (ref >90)
GFR calc Af Amer: 33 mL/min — ABNORMAL LOW (ref >90)
GFR calc non Af Amer: 27 mL/min — ABNORMAL LOW (ref >90)
GFR calc non Af Amer: 27 mL/min — ABNORMAL LOW (ref >90)
GFR calc non Af Amer: 28 mL/min — ABNORMAL LOW (ref >90)
GFR calc non Af Amer: 28 mL/min — ABNORMAL LOW (ref >90)
GFR calc non Af Amer: 29 mL/min — ABNORMAL LOW (ref >90)
GFR, EST AFRICAN AMERICAN: 31 mL/min — AB (ref >90)
GFR, EST AFRICAN AMERICAN: 34 mL/min — AB (ref >90)
GFR, EST AFRICAN AMERICAN: 30 mL/min — AB (ref >90)
GFR, EST NON AFRICAN AMERICAN: 26 mL/min — AB (ref >90)
Glucose, Bld: 141 mg/dL — ABNORMAL HIGH (ref 70–99)
Glucose, Bld: 208 mg/dL — ABNORMAL HIGH (ref 70–99)
Glucose, Bld: 212 mg/dL — ABNORMAL HIGH (ref 70–99)
Glucose, Bld: 266 mg/dL — ABNORMAL HIGH (ref 70–99)
Glucose, Bld: 308 mg/dL — ABNORMAL HIGH (ref 70–99)
Glucose, Bld: 411 mg/dL — ABNORMAL HIGH (ref 70–99)
POTASSIUM: 3.1 mmol/L — AB (ref 3.5–5.1)
Potassium: 2.8 mmol/L — ABNORMAL LOW (ref 3.5–5.1)
Potassium: 3.2 mmol/L — ABNORMAL LOW (ref 3.5–5.1)
Potassium: 3.3 mmol/L — ABNORMAL LOW (ref 3.5–5.1)
Potassium: 3.4 mmol/L — ABNORMAL LOW (ref 3.5–5.1)
Potassium: 4.4 mmol/L (ref 3.5–5.1)
SODIUM: 141 mmol/L (ref 135–145)
SODIUM: 136 mmol/L (ref 135–145)
Sodium: 141 mmol/L (ref 135–145)
Sodium: 141 mmol/L (ref 135–145)
Sodium: 143 mmol/L (ref 135–145)
Sodium: 141 mmol/L (ref 135–145)

## 2015-01-30 LAB — CBC
HEMATOCRIT: 35.5 % — AB (ref 39.0–52.0)
HEMOGLOBIN: 11.3 g/dL — AB (ref 13.0–17.0)
MCH: 26.2 pg (ref 26.0–34.0)
MCHC: 31.8 g/dL (ref 30.0–36.0)
MCV: 82.2 fL (ref 78.0–100.0)
PLATELETS: 181 10*3/uL (ref 150–400)
RBC: 4.32 MIL/uL (ref 4.22–5.81)
RDW: 14.9 % (ref 11.5–15.5)
WBC: 11.5 10*3/uL — AB (ref 4.0–10.5)

## 2015-01-30 LAB — POCT I-STAT 3, ART BLOOD GAS (G3+)
ACID-BASE DEFICIT: 14 mmol/L — AB (ref 0.0–2.0)
ACID-BASE DEFICIT: 9 mmol/L — AB (ref 0.0–2.0)
ACID-BASE DEFICIT: 10 mmol/L — AB (ref 0.0–2.0)
Acid-base deficit: 15 mmol/L — ABNORMAL HIGH (ref 0.0–2.0)
Acid-base deficit: 10 mmol/L — ABNORMAL HIGH (ref 0.0–2.0)
BICARBONATE: 14.8 meq/L — AB (ref 20.0–24.0)
BICARBONATE: 16.1 meq/L — AB (ref 20.0–24.0)
Bicarbonate: 14.7 mEq/L — ABNORMAL LOW (ref 20.0–24.0)
Bicarbonate: 18 mEq/L — ABNORMAL LOW (ref 20.0–24.0)
Bicarbonate: 15.2 mEq/L — ABNORMAL LOW (ref 20.0–24.0)
O2 SAT: 95 %
O2 SAT: 90 %
O2 Saturation: 90 %
O2 Saturation: 94 %
O2 Saturation: 94 %
PCO2 ART: 48.1 mmHg — AB (ref 35.0–45.0)
PCO2 ART: 48.2 mmHg — AB (ref 35.0–45.0)
PCO2 ART: 21.8 mmHg — AB (ref 35.0–45.0)
PH ART: 7.182 — AB (ref 7.350–7.450)
PH ART: 7.186 — AB (ref 7.350–7.450)
PH ART: 7.424 (ref 7.350–7.450)
Patient temperature: 32.6
TCO2: 16 mmol/L (ref 0–100)
TCO2: 19 mmol/L (ref 0–100)
TCO2: 17 mmol/L (ref 0–100)
TCO2: 16 mmol/L (ref 0–100)
TCO2: 17 mmol/L (ref 0–100)
pCO2 arterial: 37.8 mmHg (ref 35.0–45.0)
pCO2 arterial: 30 mmHg — ABNORMAL LOW (ref 35.0–45.0)
pH, Arterial: 7.093 — CL (ref 7.350–7.450)
pH, Arterial: 7.318 — ABNORMAL LOW (ref 7.350–7.450)
pO2, Arterial: 104 mmHg — ABNORMAL HIGH (ref 80.0–100.0)
pO2, Arterial: 73 mmHg — ABNORMAL LOW (ref 80.0–100.0)
pO2, Arterial: 58 mmHg — ABNORMAL LOW (ref 80.0–100.0)
pO2, Arterial: 56 mmHg — ABNORMAL LOW (ref 80.0–100.0)
pO2, Arterial: 64 mmHg — ABNORMAL LOW (ref 80.0–100.0)

## 2015-01-30 LAB — BLOOD GAS, ARTERIAL
ACID-BASE DEFICIT: 8.6 mmol/L — AB (ref 0.0–2.0)
BICARBONATE: 16.5 meq/L — AB (ref 20.0–24.0)
Drawn by: 313061
FIO2: 0.4 %
MECHVT: 550 mL
O2 SAT: 91.2 %
PATIENT TEMPERATURE: 90.9
PEEP: 5 cmH2O
PO2 ART: 52.4 mmHg — AB (ref 80.0–100.0)
RATE: 20 resp/min
TCO2: 17.5 mmol/L (ref 0–100)
pCO2 arterial: 27.5 mmHg — ABNORMAL LOW (ref 35.0–45.0)
pH, Arterial: 7.369 (ref 7.350–7.450)

## 2015-01-30 LAB — LACTIC ACID, PLASMA
LACTIC ACID, VENOUS: 5.3 mmol/L — AB (ref 0.5–2.0)
LACTIC ACID, VENOUS: 8.5 mmol/L — AB (ref 0.5–2.0)
Lactic Acid, Venous: 8 mmol/L (ref 0.5–2.0)

## 2015-01-30 LAB — GLUCOSE, CAPILLARY
GLUCOSE-CAPILLARY: 205 mg/dL — AB (ref 70–99)
GLUCOSE-CAPILLARY: 213 mg/dL — AB (ref 70–99)
GLUCOSE-CAPILLARY: 215 mg/dL — AB (ref 70–99)
GLUCOSE-CAPILLARY: 286 mg/dL — AB (ref 70–99)
GLUCOSE-CAPILLARY: 320 mg/dL — AB (ref 70–99)
GLUCOSE-CAPILLARY: 369 mg/dL — AB (ref 70–99)
Glucose-Capillary: 297 mg/dL — ABNORMAL HIGH (ref 70–99)
Glucose-Capillary: 231 mg/dL — ABNORMAL HIGH (ref 70–99)
Glucose-Capillary: 240 mg/dL — ABNORMAL HIGH (ref 70–99)
Glucose-Capillary: 277 mg/dL — ABNORMAL HIGH (ref 70–99)
Glucose-Capillary: 315 mg/dL — ABNORMAL HIGH (ref 70–99)
Glucose-Capillary: 354 mg/dL — ABNORMAL HIGH (ref 70–99)
Glucose-Capillary: 392 mg/dL — ABNORMAL HIGH (ref 70–99)
Glucose-Capillary: 146 mg/dL — ABNORMAL HIGH (ref 70–99)
Glucose-Capillary: 167 mg/dL — ABNORMAL HIGH (ref 70–99)
Glucose-Capillary: 176 mg/dL — ABNORMAL HIGH (ref 70–99)
Glucose-Capillary: 185 mg/dL — ABNORMAL HIGH (ref 70–99)
Glucose-Capillary: 206 mg/dL — ABNORMAL HIGH (ref 70–99)
Glucose-Capillary: 209 mg/dL — ABNORMAL HIGH (ref 70–99)
Glucose-Capillary: 115 mg/dL — ABNORMAL HIGH (ref 70–99)
Glucose-Capillary: 121 mg/dL — ABNORMAL HIGH (ref 70–99)
Glucose-Capillary: 87 mg/dL (ref 70–99)
Glucose-Capillary: 87 mg/dL (ref 70–99)

## 2015-01-30 LAB — APTT
aPTT: 55 seconds — ABNORMAL HIGH (ref 24–37)
aPTT: 41 seconds — ABNORMAL HIGH (ref 24–37)

## 2015-01-30 LAB — HEPATIC FUNCTION PANEL
ALT: 112 U/L — AB (ref 0–53)
AST: 256 U/L — ABNORMAL HIGH (ref 0–37)
Albumin: 2.7 g/dL — ABNORMAL LOW (ref 3.5–5.2)
Alkaline Phosphatase: 86 U/L (ref 39–117)
BILIRUBIN DIRECT: 0.1 mg/dL (ref 0.0–0.5)
BILIRUBIN INDIRECT: 0.5 mg/dL (ref 0.3–0.9)
Total Bilirubin: 0.6 mg/dL (ref 0.3–1.2)
Total Protein: 4.2 g/dL — ABNORMAL LOW (ref 6.0–8.3)

## 2015-01-30 LAB — POCT I-STAT, CHEM 8
BUN: 23 mg/dL (ref 6–23)
CHLORIDE: 107 mmol/L (ref 96–112)
CREATININE: 1.5 mg/dL — AB (ref 0.50–1.35)
Calcium, Ion: 1.11 mmol/L — ABNORMAL LOW (ref 1.13–1.30)
GLUCOSE: 317 mg/dL — AB (ref 70–99)
HCT: 38 % — ABNORMAL LOW (ref 39.0–52.0)
Hemoglobin: 12.9 g/dL — ABNORMAL LOW (ref 13.0–17.0)
Potassium: 4.3 mmol/L (ref 3.5–5.1)
Sodium: 139 mmol/L (ref 135–145)
TCO2: 18 mmol/L (ref 0–100)

## 2015-01-30 LAB — INFLUENZA PANEL BY PCR (TYPE A & B)
H1N1 flu by pcr: NOT DETECTED
Influenza A By PCR: NEGATIVE
Influenza B By PCR: NEGATIVE

## 2015-01-30 LAB — PROTIME-INR
INR: 1.93 — ABNORMAL HIGH (ref 0.00–1.49)
INR: 1.46 (ref 0.00–1.49)
PROTHROMBIN TIME: 17.9 s — AB (ref 11.6–15.2)
Prothrombin Time: 22.2 seconds — ABNORMAL HIGH (ref 11.6–15.2)

## 2015-01-30 LAB — TROPONIN I
TROPONIN I: 32.43 ng/mL — AB (ref <0.031)
TROPONIN I: 28.68 ng/mL — AB (ref <0.031)
Troponin I: 31.83 ng/mL (ref <0.031)
Troponin I: 29.85 ng/mL (ref <0.031)

## 2015-01-30 LAB — MAGNESIUM: Magnesium: 1.9 mg/dL (ref 1.5–2.5)

## 2015-01-30 LAB — STREP PNEUMONIAE URINARY ANTIGEN: Strep Pneumo Urinary Antigen: NEGATIVE

## 2015-01-30 LAB — POCT ACTIVATED CLOTTING TIME: Activated Clotting Time: 589 seconds

## 2015-01-30 LAB — MRSA PCR SCREENING: MRSA by PCR: POSITIVE — AB

## 2015-01-30 LAB — CORTISOL: Cortisol, Plasma: 41.2 ug/dL

## 2015-01-30 LAB — PROCALCITONIN: PROCALCITONIN: 2.36 ng/mL

## 2015-01-30 MED ORDER — SODIUM BICARBONATE 8.4 % IV SOLN
INTRAVENOUS | Status: AC
  Filled 2015-01-30: qty 50

## 2015-01-30 MED ORDER — MIDAZOLAM HCL 2 MG/2ML IJ SOLN: 1.0000 mg | INTRAMUSCULAR | Status: DC | PRN

## 2015-01-30 MED ORDER — SODIUM CHLORIDE 0.9 % IV SOLN
1.0000 mg/h | INTRAVENOUS | Status: DC
  Administered 2015-01-30 – 2015-01-31 (×3): 6 mg/h via INTRAVENOUS
  Filled 2015-01-30 (×3): qty 10

## 2015-01-30 MED ORDER — SODIUM CHLORIDE 0.9 % IJ SOLN: 10.0000 mL | INTRAMUSCULAR | Status: DC | PRN

## 2015-01-30 MED ORDER — FENTANYL CITRATE 0.05 MG/ML IJ SOLN: 100.0000 ug | INTRAMUSCULAR | Status: DC | PRN

## 2015-01-30 MED ORDER — CHLORHEXIDINE GLUCONATE 0.12 % MT SOLN
15.0000 mL | Freq: Two times a day (BID) | OROMUCOSAL | Status: DC
  Administered 2015-01-30 – 2015-01-31 (×4): 15 mL via OROMUCOSAL
  Filled 2015-01-30 (×2): qty 15

## 2015-01-30 MED ORDER — CHLORHEXIDINE GLUCONATE CLOTH 2 % EX PADS: 6.0000 | MEDICATED_PAD | Freq: Every day | CUTANEOUS | Status: DC

## 2015-01-30 MED ORDER — PHENYLEPHRINE HCL 10 MG/ML IJ SOLN
30.0000 ug/min | INTRAVENOUS | Status: DC
  Administered 2015-01-30: 30 ug/min via INTRAVENOUS
  Administered 2015-01-31: 20 ug/min via INTRAVENOUS
  Filled 2015-01-30 (×2): qty 1

## 2015-01-30 MED ORDER — HYDROCORTISONE NA SUCCINATE PF 100 MG IJ SOLR
50.0000 mg | Freq: Four times a day (QID) | INTRAMUSCULAR | Status: DC
  Administered 2015-01-30 – 2015-01-31 (×5): 50 mg via INTRAVENOUS
  Filled 2015-01-30 (×9): qty 1

## 2015-01-30 MED ORDER — SODIUM CHLORIDE 0.9 % IV SOLN: 2000.0000 mL | Freq: Once | INTRAVENOUS | Status: AC

## 2015-01-30 MED ORDER — POTASSIUM CHLORIDE 10 MEQ/50ML IV SOLN
10.0000 meq | INTRAVENOUS | Status: AC
  Administered 2015-01-30 (×4): 10 meq via INTRAVENOUS
  Filled 2015-01-30 (×2): qty 50

## 2015-01-30 MED ORDER — DOCUSATE SODIUM 50 MG/5ML PO LIQD
100.0000 mg | Freq: Two times a day (BID) | ORAL | Status: DC | PRN
  Filled 2015-01-30: qty 10

## 2015-01-30 MED ORDER — CHLORHEXIDINE GLUCONATE CLOTH 2 % EX PADS
6.0000 | MEDICATED_PAD | Freq: Every day | CUTANEOUS | Status: DC
  Administered 2015-01-30 – 2015-01-31 (×2): 6 via TOPICAL

## 2015-01-30 MED ORDER — VANCOMYCIN HCL IN DEXTROSE 1-5 GM/200ML-% IV SOLN
1000.0000 mg | INTRAVENOUS | Status: DC
  Filled 2015-01-30: qty 200

## 2015-01-30 MED ORDER — STERILE WATER FOR INJECTION IV SOLN
INTRAVENOUS | Status: DC
  Administered 2015-01-30 – 2015-01-31 (×3): via INTRAVENOUS
  Filled 2015-01-30 (×5): qty 850

## 2015-01-30 MED ORDER — VASOPRESSIN 20 UNIT/ML IV SOLN
0.0300 [IU]/min | INTRAVENOUS | Status: DC
  Administered 2015-01-30 (×2): 0.03 [IU]/min via INTRAVENOUS
  Filled 2015-01-30 (×3): qty 2

## 2015-01-30 MED ORDER — SODIUM CHLORIDE 0.9 % IV SOLN: 0.5000 ug/kg/min | INTRAVENOUS | Status: DC

## 2015-01-30 MED ORDER — VANCOMYCIN HCL 10 G IV SOLR
1500.0000 mg | Freq: Once | INTRAVENOUS | Status: AC
  Administered 2015-01-30: 1500 mg via INTRAVENOUS
  Filled 2015-01-30: qty 1500

## 2015-01-30 MED ORDER — CHLORHEXIDINE GLUCONATE 0.12 % MT SOLN: 15.0000 mL | Freq: Two times a day (BID) | OROMUCOSAL | Status: DC

## 2015-01-30 MED ORDER — INSULIN ASPART 100 UNIT/ML ~~LOC~~ SOLN: 2.0000 [IU] | SUBCUTANEOUS | Status: DC

## 2015-01-30 MED ORDER — NOREPINEPHRINE BITARTRATE 1 MG/ML IV SOLN: 0.0000 ug/min | INTRAVENOUS | Status: DC

## 2015-01-30 MED ORDER — INSULIN GLARGINE 100 UNIT/ML ~~LOC~~ SOLN
30.0000 [IU] | SUBCUTANEOUS | Status: DC
  Administered 2015-01-30: 30 [IU] via SUBCUTANEOUS
  Filled 2015-01-30 (×2): qty 0.3

## 2015-01-30 MED ORDER — BIOTENE DRY MOUTH MT LIQD
15.0000 mL | Freq: Four times a day (QID) | OROMUCOSAL | Status: DC
Start: 2015-01-30 — End: 2015-01-31
  Administered 2015-01-30 – 2015-01-31 (×7): 15 mL via OROMUCOSAL

## 2015-01-30 MED ORDER — ASPIRIN 300 MG RE SUPP: 300.0000 mg | RECTAL | Status: AC

## 2015-01-30 MED ORDER — MAGNESIUM SULFATE 2 GM/50ML IV SOLN
2.0000 g | Freq: Once | INTRAVENOUS | Status: AC
  Administered 2015-01-30: 2 g via INTRAVENOUS

## 2015-01-30 MED ORDER — NOREPINEPHRINE BITARTRATE 1 MG/ML IV SOLN
0.0000 ug/min | INTRAVENOUS | Status: DC
  Administered 2015-01-30: 50 ug/min via INTRAVENOUS
  Administered 2015-01-30: 46 ug/min via INTRAVENOUS
  Administered 2015-01-31 (×4): 50 ug/min via INTRAVENOUS
  Filled 2015-01-30 (×6): qty 16

## 2015-01-30 MED ORDER — MAGNESIUM SULFATE 2 GM/50ML IV SOLN
INTRAVENOUS | Status: AC
  Filled 2015-01-30: qty 50

## 2015-01-30 MED ORDER — SODIUM CHLORIDE 0.9 % IJ SOLN
10.0000 mL | Freq: Two times a day (BID) | INTRAMUSCULAR | Status: DC
  Administered 2015-01-30 – 2015-01-31 (×2): 10 mL

## 2015-01-30 MED ORDER — ASPIRIN 81 MG PO CHEW
81.0000 mg | CHEWABLE_TABLET | Freq: Every day | ORAL | Status: DC
  Administered 2015-01-31: 81 mg
  Filled 2015-01-30: qty 1

## 2015-01-30 MED ORDER — SODIUM CHLORIDE 0.9 % IV SOLN
500.0000 mg | Freq: Four times a day (QID) | INTRAVENOUS | Status: DC
  Administered 2015-01-30 – 2015-01-31 (×5): 500 mg via INTRAVENOUS
  Filled 2015-01-30 (×7): qty 500

## 2015-01-30 MED ORDER — MUPIROCIN 2 % EX OINT
1.0000 | TOPICAL_OINTMENT | Freq: Two times a day (BID) | CUTANEOUS | Status: DC
Start: 2015-01-30 — End: 2015-01-31
  Administered 2015-01-30 – 2015-01-31 (×4): 1 via NASAL
  Filled 2015-01-30 (×2): qty 22

## 2015-01-30 MED ORDER — SODIUM BICARBONATE 8.4 % IV SOLN
100.0000 meq | Freq: Once | INTRAVENOUS | Status: AC
  Administered 2015-01-30: 100 meq via INTRAVENOUS

## 2015-01-30 MED ORDER — PANTOPRAZOLE SODIUM 40 MG PO PACK
40.0000 mg | PACK | Freq: Every day | ORAL | Status: DC
  Administered 2015-01-30 – 2015-01-31 (×2): 40 mg
  Filled 2015-01-30 (×2): qty 20

## 2015-01-30 MED FILL — Sodium Chloride IV Soln 0.9%: INTRAVENOUS | Qty: 50 | Status: AC

## 2015-01-30 NOTE — Progress Notes (Signed)
Pt with noted myclonic activity after dc of nimbex gtt, Dr Titus Mould notified and orders received for medication

## 2015-01-30 NOTE — Progress Notes (Signed)
Cards and CCM notified of pts continual hypotension. Orders received for vasopressin.  Pt still continues to be hypotensive despite addition of vasopressin.  ABG drawn per PRN order.  Elink made aware. Orders received.

## 2015-01-30 NOTE — Progress Notes (Addendum)
Dr Deterding notified of labile BP with PVCs. Pt with frequent multifocal/unifocal PVCs.  No new orders.

## 2015-01-30 NOTE — Progress Notes (Addendum)
Spoke to Dr. Jimmy Footman in reguards to frequent PVCs. Mag ordered.

## 2015-01-30 NOTE — Progress Notes (Signed)
[facility] Progress Note Patient Name: Jeremy Mcdaniel DOB: 1923-04-19 MRN: [id number]   Date of Service  01/30/2015  HPI/Events of Note  Hypokalemia  eICU Interventions  Potassium replaced     Intervention Category Intermediate Interventions: Electrolyte abnormality - evaluation and management  DETERDING,ELIZABETH 01/30/2015, 5:20 AM

## 2015-01-30 NOTE — Progress Notes (Signed)
Critical troponin received. MD aware of results.

## 2015-01-30 NOTE — Progress Notes (Addendum)
Worsening shock since my morning rounds Now back on 3 pressors,Max on levophed   Recent Labs Lab 01/30/15 0615  PROCALCITON 2.36    PCT suggess possible sepsis  Plan Dc  33C hypthermia Dc nimbex Aim for normothermia with pads - changed to 36C protocol  D/w Dr Julieanne Manson at bedside  Dr. Brand Males, M.D., [facility].C.P Pulmonary and Critical Care Medicine Staff Physician [facility] Pager: [phone], If no answer or between  15:00h - 7:00h: call [phone]  01/30/2015 11:08 AM

## 2015-01-30 NOTE — Progress Notes (Signed)
EEG Completed; Results Pending  

## 2015-01-30 NOTE — Procedures (Signed)
EEG report.  Brief clinical history:  81 year old male with PMH as below, which is significant for HTN, HLD, CLL, DM and CHF. He was outside at his house gardening when he suffered a witnessed cardiac arrest. Wife called EMS, 4-5 mins without CPR until their arrival. He was found to be in VF arrest. Underwent 20 mins ACLS epi x 4 and defibrillation. Total downtime estimated 25 mins. In ED he was found to have inferior ST elevation on EKG and was taken to cath lab. He had 100% LAD occlusion requiring PCI. During this he converted to VF twice, converting back to sinus with a single defibrillation each time. Post-procedurally he was taken to ICU on ventilator.  Technique: this is a 17 channel routine scalp EEG performed at the bedside in the ICU, with bipolar and monopolar montages arranged in accordance to the international 10/20 system of electrode placement. One channel was dedicated to EKG recording.  The patient is intubated on the vent, sedated with fentanyl, midazolam  Description: as the study begins and throughout the entire recording, there is marked and prolonged periods of background suppression with intermixed and very brief bursts of low amplitude theta-delta activity. No electrographic seizures noted. No focal or generalized epileptiform discharges noted.  EKG showed sinus rhythm.  Impression: this is markedly abnormal EEG because of the presence of very prolonged periods of background suppression with intermixed bursts of low amplitude theta-delta activity. The overall pattern is highly indicative of severe brain dysfunction that in this particular scenario is likely the result of severe anoxic ischemic encephalopathy and usually confers a poor prognosis. Clinical correlation is advised.   Dorian Pod, MD [facility].

## 2015-01-30 NOTE — Progress Notes (Signed)
Per elink, CVC okay to use

## 2015-01-30 NOTE — Progress Notes (Signed)
INITIAL NUTRITION ASSESSMENT  DOCUMENTATION CODES Per approved criteria  -Not Applicable   INTERVENTION: Once pt has been rewarmed recommend:  Initiate Vital AF 1.2 @ 20 ml/hr via OG tube and increase by 10 ml every 4 hours to goal rate of 60 ml/hr.   Tube feeding regimen provides 1728 kcal (98% of needs), 108 grams of protein, and 1167 ml of H2O.    NUTRITION DIAGNOSIS: Inadequate oral intake related to inability to eat as evidenced by NPO status  Goal: Pt to meet >/= 90% of their estimated nutrition needs   Monitor:  Vent status, Temperature, TF initiation, labs   Reason for Assessment: Consult received to initiate and manage enteral nutrition support.  81 y.o. male  Admitting Dx: ST elevation myocardial infarction (STEMI) of anterior wall, initial episode of care  ASSESSMENT: Pt with hx of HTN, CHF, DM admitted from home with witnessed cardiac arrest. Pt intubated taken to cath lab where he had 100% LAD occlusion requiring PCI.   Patient is currently intubated on ventilator support MV: 11.3 L/min Temp (24hrs), Avg:91.4 F (33 C), Min:89.1 F (31.7 C), Max:94.3 F (34.6 C)  Pt currently on hypothermia protocol. Cooling temperature reached @ 2315 on 3/22. Pt likely ready for enteral nutrition on 3/24.  Labs reviewed:  CBGs: 231-308 Discussed with RN.  No signs of fat or muscle depletion noted on exam.  No family present at this time.   Height: Ht Readings from Last 1 Encounters:  01/21/2015 5\' 8"  (1.727 m)    Weight: Wt Readings from Last 1 Encounters:  01/30/2015 203 lb 11.3 oz (92.4 kg)  Usual weight: 190 lb (86 kg) with BMI of 28.8  Ideal Body Weight: 70 kg   % Ideal Body Weight: 132%  Wt Readings from Last 10 Encounters:  02/03/2015 203 lb 11.3 oz (92.4 kg)  11/20/14 189 lb 9.6 oz (86.002 kg)  10/20/14 190 lb 11.2 oz (86.5 kg)  05/21/14 187 lb 1.6 oz (84.868 kg)  04/26/14 186 lb (84.369 kg)  11/21/13 195 lb 12.8 oz (88.814 kg)  05/22/13 189 lb 1.6 oz  (85.775 kg)  02/23/13 190 lb 11.2 oz (86.501 kg)  01/24/13 192 lb 11.2 oz (87.408 kg)  12/27/12 192 lb 6.4 oz (87.272 kg)    Usual Body Weight: 190 lb   % Usual Body Weight: 107%  BMI:  Body mass index is 30.98 kg/(m^2).  Estimated Nutritional Needs: Kcal: 6440 Protein: 108-120 Fluid: > 1.7 L/day  Skin: intact  Diet Order:    EDUCATION NEEDS: -No education needs identified at this time   Intake/Output Summary (Last 24 hours) at 01/30/15 0906 Last data filed at 01/30/15 0834  Gross per 24 hour  Intake 3326.51 ml  Output    925 ml  Net 2401.51 ml    Last BM: 3/22 - loose/watery   Labs:   Recent Labs Lab 01/17/2015 2359 01/30/15 0008 01/30/15 0315 01/30/15 0615  NA 136  --  141 141  K 4.4  --  3.4* 2.8*  CL 105  --  104 108  CO2 18*  --  19 20  BUN 27*  --  27* 28*  CREATININE 1.99*  --  2.06* 2.06*  CALCIUM 7.3*  --  7.3* 7.3*  MG  --  1.9  --   --   GLUCOSE 411*  --  308* 266*    CBG (last 3)   Recent Labs  01/30/15 0504 01/30/15 0610 01/30/15 0715  GLUCAP 277* 240* 231*  Scheduled Meds: . sodium chloride  2,000 mL Intravenous Once  . sodium chloride  2,000 mL Intravenous Once  . antiseptic oral rinse  15 mL Mouth Rinse QID  . artificial tears  1 application Both Eyes 3 times per day  . [MAR Hold] aspirin  300 mg Rectal Once  . chlorhexidine  15 mL Mouth/Throat BID  . Chlorhexidine Gluconate Cloth  6 each Topical Q0600  . hydrocortisone sod succinate (SOLU-CORTEF) inj  50 mg Intravenous 4 times per day  . imipenem-cilastatin  500 mg Intravenous Q6H  . mupirocin ointment  1 application Nasal BID  . pantoprazole (PROTONIX) IV  40 mg Intravenous QHS  . potassium chloride  10 mEq Intravenous Q1 Hr x 4  . sodium chloride  10-40 mL Intracatheter Q12H  . sodium chloride  3 mL Intravenous Q12H  . ticagrelor  90 mg Oral BID  . vancomycin  1,500 mg Intravenous Once  . [START ON 02/01/2015] vancomycin  1,000 mg Intravenous Q48H    Continuous  Infusions: . amiodarone 30 mg/hr (01/30/15 0738)  . cisatracurium (NIMBEX) infusion 1 mcg/kg/min (01/30/15 0700)  . fentaNYL infusion INTRAVENOUS 150 mcg/hr (01/30/15 0700)  . insulin (NOVOLIN-R) infusion 15.3 mL/hr at 01/30/15 0820  . midazolam (VERSED) infusion 2 mg/hr (01/30/15 0508)  . norepinephrine (LEVOPHED) Adult infusion 50 mcg/min (01/30/15 0858)  . phenylephrine (NEO-SYNEPHRINE) Adult infusion Stopped (01/30/15 0500)  .  sodium bicarbonate 150 mEq in sterile water 1000 mL infusion 75 mL/hr at 01/30/15 0700  . vasopressin (PITRESSIN) infusion - *FOR SHOCK* 0.03 Units/min (01/30/15 0700)    Past Medical History  Diagnosis Date  . Hypertension   . Hypercholesterolemia   . Hernia   . Hypercholesterolemia   . Leg cramps     at night   . CLL (chronic lymphocytic leukemia)   . Diastolic dysfunction 59/93/5701    Grade 2. Ejection fraction 60%.  Marland Kitchen TIA (transient ischemic attack) 10/21/2014  . Diabetes mellitus type II, controlled, with no complications     Diet-controlled    Past Surgical History  Procedure Laterality Date  . Hemorrhoid surgery    . Hernia repair      laparoscopic ventral -had 3 at [facility]. Total of 4 hernia surgeries  . Cataract extraction w/phaco  05/16/2012    Procedure: CATARACT EXTRACTION PHACO AND INTRAOCULAR LENS PLACEMENT (IOC);  Surgeon: Tonny Branch, MD;  Location: [facility];  Service: Ophthalmology;  Laterality: Left;  CDE:17.23  . Cataract extraction w/phaco  07/14/2012    Procedure: CATARACT EXTRACTION PHACO AND INTRAOCULAR LENS PLACEMENT (IOC);  Surgeon: Tonny Branch, MD;  Location: [facility];  Service: Ophthalmology;  Laterality: Right;  CDE=15.85  . Left heart catheterization with coronary angiogram N/A 01/19/2015    Procedure: LEFT HEART CATHETERIZATION WITH CORONARY ANGIOGRAM;  Surgeon: Leonie Man, MD;  Location: [facility] CATH LAB;  Service: Cardiovascular;  Laterality: N/A;    Maylon Peppers RD, [place], [place] Pager [phone] After Hours Pager

## 2015-01-30 NOTE — Progress Notes (Signed)
ANTIBIOTIC CONSULT NOTE - INITIAL  Pharmacy Consult for Vancomycin and Primaxin Indication: rule out sepsis  Allergies  Allergen Reactions  . Anesthetics, Amide Rash    Had medicine to put him to sleep for hernia repair -and he broke out in rash . He got benadryl for it  . Azulfidine [Sulfasalazine] Rash  . Codeine Rash  . Penicillins Rash  . Sulfa Antibiotics Rash    Patient Measurements: Height: 5\' 8"  (172.7 cm) Weight: 203 lb 11.3 oz (92.4 kg) IBW/kg (Calculated) : 68.4 Adjusted Body Weight: 80 kg Vital Signs: Temp: 91.4 F (33 C) (03/23 0600) Temp Source: Core (Comment) (03/23 0600) BP: 152/73 mmHg (03/23 0400) Pulse Rate: 56 (03/23 0615) Intake/Output from previous day: 03/22 0701 - 03/23 0700 In: 2307.7 [I.V.:2257.7; IV Piggyback:50] Out: 450 [Urine:450] Intake/Output from this shift: Total I/O In: 2307.7 [I.V.:2257.7; IV Piggyback:50] Out: 450 [Urine:450]  Labs:  Recent Labs  01/24/2015 1730 01/11/2015 2030 01/10/2015 2228 01/11/2015 2359 01/30/15 0315  WBC 16.8* 17.2*  --   --  11.5*  HGB 11.7* 12.4*  --   --  11.3*  PLT 187 213  --   --  181  CREATININE 1.89* 1.90*  1.83* 1.97* 1.99* 2.06*   Estimated Creatinine Clearance: 26.8 mL/min (by C-G formula based on Cr of 2.06). No results for input(s): VANCOTROUGH, VANCOPEAK, VANCORANDOM, GENTTROUGH, GENTPEAK, GENTRANDOM, TOBRATROUGH, TOBRAPEAK, TOBRARND, AMIKACINPEAK, AMIKACINTROU, AMIKACIN in the last 72 hours.   Microbiology: Recent Results (from the past 720 hour(s))  MRSA PCR Screening     Status: Abnormal   Collection Time: 01/28/2015  8:55 PM  Result Value Ref Range Status   MRSA by PCR POSITIVE (A) NEGATIVE Final    Comment:        The GeneXpert MRSA Assay (FDA approved for NASAL specimens only), is one component of a comprehensive MRSA colonization surveillance program. It is not intended to diagnose MRSA infection nor to guide or monitor treatment for MRSA infections. RESULT CALLED TO, READ BACK  BY AND VERIFIED WITH: CALLED TO RN Martinique PERKINS [id number] @0041  THANEY     Medical History: Past Medical History  Diagnosis Date  . Hypertension   . Hypercholesterolemia   . Hernia   . Hypercholesterolemia   . Leg cramps     at night   . CLL (chronic lymphocytic leukemia)   . Diastolic dysfunction 04/54/0981    Grade 2. Ejection fraction 60%.  Marland Kitchen TIA (transient ischemic attack) 10/21/2014  . Diabetes mellitus type II, controlled, with no complications     Diet-controlled    Medications:  Scheduled:  . sodium chloride  2,000 mL Intravenous Once  . sodium chloride  2,000 mL Intravenous Once  . antiseptic oral rinse  15 mL Mouth Rinse QID  . artificial tears  1 application Both Eyes 3 times per day  . [MAR Hold] aspirin  300 mg Rectal Once  . chlorhexidine  15 mL Mouth/Throat BID  . Chlorhexidine Gluconate Cloth  6 each Topical Q0600  . mupirocin ointment  1 application Nasal BID  . pantoprazole (PROTONIX) IV  40 mg Intravenous QHS  . potassium chloride  10 mEq Intravenous Q1 Hr x 4  . sodium chloride  3 mL Intravenous Q12H  . ticagrelor  90 mg Oral BID   Assessment: 81 y.o. male with hypotension/VDRF s/p cardiac arrest, possible sepsis, for empiric antibiotics  Goal of Therapy:  Vancomycin trough level 15-20 mcg/ml  Plan:  Vancomycin 1500 mg IV now, then 1 g IV q48h Primaxin 250  mg IV q6h  Caryl Pina 01/30/2015,6:20 AM

## 2015-01-30 NOTE — Progress Notes (Addendum)
cangrelor and angiomax d/c after giving ticagrelor per verbal order from Dr. Ellyn Hack

## 2015-01-30 NOTE — Progress Notes (Signed)
DAILY PROGRESS NOTE  Subjective:  Events of yesterday reviewed. Found to have anterior STEMI with 100% LAD occlusion - s/p PCI to the LAD with placement of a 3.5 x 20 mm Promus Premier stent - post-dilated to 3.9 mm. Unfortunately, he continues to be in shock. He is on the hypothermia protocol but is requiring 3 pressors to keep MAP at 80. Remains intubated, sedated and paralyzed.  Objective:  Temp:  [89.1 F (31.7 C)-94.3 F (34.6 C)] 89.4 F (31.9 C) (03/23 1000) Pulse Rate:  [54-158] 57 (03/23 1015) Resp:  [0-35] 20 (03/23 1015) BP: (71-152)/(36-120) 152/73 mmHg (03/23 0400) SpO2:  [80 %-100 %] 92 % (03/23 1015) Arterial Line BP: (76-171)/(45-82) 110/58 mmHg (03/23 1015) FiO2 (%):  [40 %-100 %] 40 % (03/23 0800) Weight:  [203 lb 11.3 oz (92.4 kg)] 203 lb 11.3 oz (92.4 kg) (03/22 1955) Weight change:   Intake/Output from previous day: 03/22 0701 - 03/23 0700 In: 2892.4 [I.V.:2642.4; NG/GT:100; IV Piggyback:150] Out: 925 [Urine:925]  Intake/Output from this shift: Total I/O In: 671.5 [I.V.:271.5; IV Piggyback:400] Out: 85 [Urine:85]  Medications: Current Facility-Administered Medications  Medication Dose Route Frequency Provider Last Rate Last Dose  . 0.9 %  sodium chloride infusion  250 mL Intravenous PRN Leonie Man, MD 10 mL/hr at 01/30/15 0700 250 mL at 01/30/15 0700  . 0.9 %  sodium chloride infusion  2,000 mL Intravenous Once Raylene Miyamoto, MD   2,000 mL at 01/08/2015 2045  . 0.9 %  sodium chloride infusion  2,000 mL Intravenous Once Rahul P Desai, PA-C   2,000 mL at 01/24/2015 2100  . acetaminophen (TYLENOL) tablet 650 mg  650 mg Oral Q4H PRN Leonie Man, MD      . amiodarone (NEXTERONE PREMIX) 360 MG/200ML (1.8 mg/mL) IV infusion  30 mg/hr Intravenous Continuous Leonie Man, MD 16.7 mL/hr at 01/30/15 0738 30 mg/hr at 01/30/15 0738  . antiseptic oral rinse (BIOTENE) solution 15 mL  15 mL Mouth Rinse QID Leonie Man, MD   15 mL at 01/30/15 0400  .  artificial tears (LACRILUBE) ophthalmic ointment 1 application  1 application Both Eyes 3 times per day Rahul Dianna Rossetti, PA-C   1 application at 40/98/11 0522  . [MAR Hold] aspirin suppository 300 mg  300 mg Rectal Once Romona Curls, [facility]      . chlorhexidine (PERIDEX) 0.12 % solution 15 mL  15 mL Mouth/Throat BID Leonie Man, MD   15 mL at 01/30/15 0920  . Chlorhexidine Gluconate Cloth 2 % PADS 6 each  6 each Topical Q0600 Leonie Man, MD   6 each at 01/30/15 [phone]  . cisatracurium (NIMBEX) 200 mg in sodium chloride 0.9 % 200 mL (1 mg/mL) infusion  1-1.5 mcg/kg/min Intravenous Continuous Raylene Miyamoto, MD 5.5 mL/hr at 01/30/15 0700 1 mcg/kg/min at 01/30/15 0700  . EPINEPHrine (ADRENALIN) 0.1 MG/ML injection   Intravenous PRN Elnora Morrison, MD   0.5 mg at 01/11/2015 1706  . etomidate (AMIDATE) injection   Intravenous PRN Elnora Morrison, MD   20 mg at 02/01/2015 1708  . fentaNYL (SUBLIMAZE) 2,500 mcg in sodium chloride 0.9 % 250 mL (10 mcg/mL) infusion  25-400 mcg/hr Intravenous Continuous Raylene Miyamoto, MD 15 mL/hr at 01/30/15 0700 150 mcg/hr at 01/30/15 0700  . fentaNYL (SUBLIMAZE) bolus via infusion 25 mcg  25 mcg Intravenous Q30 min PRN Rahul P Desai, PA-C      . hydrocortisone sodium succinate (SOLU-CORTEF) 100 MG injection  50 mg  50 mg Intravenous 4 times per day Brand Males, MD   50 mg at 01/30/15 0850  . imipenem-cilastatin (PRIMAXIN) 500 mg in sodium chloride 0.9 % 100 mL IVPB  500 mg Intravenous Q6H Leonie Man, MD   500 mg at 01/30/15 0755  . insulin regular (NOVOLIN R,HUMULIN R) 250 Units in sodium chloride 0.9 % 250 mL (1 Units/mL) infusion   Intravenous Continuous Rahul P Desai, PA-C 17.5 mL/hr at 01/30/15 1018    . midazolam (VERSED) 50 mg in sodium chloride 0.9 % 50 mL (1 mg/mL) infusion  1-10 mg/hr Intravenous Continuous Rahul P Desai, PA-C 2 mL/hr at 01/30/15 0508 2 mg/hr at 01/30/15 7412  . midazolam (VERSED) bolus via infusion 1 mg  1 mg Intravenous Q30 min PRN  Rahul P Desai, PA-C      . mupirocin ointment (BACTROBAN) 2 % 1 application  1 application Nasal BID Leonie Man, MD   1 application at 87/86/76 0920  . norepinephrine (LEVOPHED) 16 mg in dextrose 5 % 250 mL (0.064 mg/mL) infusion  0-50 mcg/min Intravenous Titrated Colbert Coyer, MD 46.9 mL/hr at 01/30/15 0858 50 mcg/min at 01/30/15 0858  . ondansetron (ZOFRAN) injection 4 mg  4 mg Intravenous Q6H PRN Leonie Man, MD      . pantoprazole (PROTONIX) injection 40 mg  40 mg Intravenous QHS Rahul P Desai, PA-C   40 mg at 02/03/2015 2312  . phenylephrine (NEO-SYNEPHRINE) 10 mg in dextrose 5 % 250 mL (0.04 mg/mL) infusion  30-200 mcg/min Intravenous Continuous Colbert Coyer, MD 60 mL/hr at 01/30/15 1018 40 mcg/min at 01/30/15 1018  . rocuronium (ZEMURON) injection   Intravenous PRN Elnora Morrison, MD   100 mg at 02/01/2015 1708  . sodium bicarbonate 150 mEq in sterile water 1,000 mL infusion   Intravenous Continuous Rahul P Desai, PA-C 75 mL/hr at 01/30/15 0700    . sodium chloride 0.9 % injection 10-40 mL  10-40 mL Intracatheter Q12H Leonie Man, MD   10 mL at 01/30/15 0919  . sodium chloride 0.9 % injection 10-40 mL  10-40 mL Intracatheter PRN Leonie Man, MD      . sodium chloride 0.9 % injection 3 mL  3 mL Intravenous Q12H Leonie Man, MD   3 mL at 01/30/15 0919  . sodium chloride 0.9 % injection 3 mL  3 mL Intravenous PRN Leonie Man, MD      . ticagrelor [facility]) tablet 90 mg  90 mg Oral BID Leonie Man, MD   90 mg at 01/30/15 0920  . [START ON 02/01/2015] vancomycin (VANCOCIN) IVPB 1000 mg/200 mL premix  1,000 mg Intravenous Q48H Leonie Man, MD      . vasopressin (PITRESSIN) 40 Units in sodium chloride 0.9 % 250 mL (0.16 Units/mL) infusion  0.03 Units/min Intravenous Continuous Guadelupe Sabin Deterding, MD 11.3 mL/hr at 01/30/15 0700 0.03 Units/min at 01/30/15 0700    Physical Exam: General appearance: intubated, sedated on vent, paralyzed, on arctic sun  protocol Neck: JVD - 3 cm above sternal notch and no carotid bruit Lungs: diminished breath sounds bilaterally Heart: regular rate and rhythm Abdomen: soft, non-tender; bowel sounds normal; no masses,  no organomegaly Extremities: cool, trace edema Pulses: faint pulses Skin: cool, pale, dry Neurologic: Mental status: Sedated, intubated on vent Psych: Cannot assess  Lab Results: Results for orders placed or performed during the hospital encounter of 01/08/2015 (from the past 48 hour(s))  I-Stat Troponin, ED (not  at [facility])     Status: Abnormal   Collection Time: 02/04/2015  5:11 PM  Result Value Ref Range   Troponin i, poc 0.42 (HH) 0.00 - 0.08 ng/mL   Comment NOTIFIED PHYSICIAN    Comment 3            Comment: Due to the release kinetics of cTnI, a negative result within the first hours of the onset of symptoms does not rule out myocardial infarction with certainty. If myocardial infarction is still suspected, repeat the test at appropriate intervals.   I-Stat Chem 8, ED     Status: Abnormal   Collection Time: 02/04/2015  5:13 PM  Result Value Ref Range   Sodium 140 135 - 145 mmol/L   Potassium 3.9 3.5 - 5.1 mmol/L   Chloride 105 96 - 112 mmol/L   BUN 23 6 - 23 mg/dL   Creatinine, Ser 1.70 (H) 0.50 - 1.35 mg/dL   Glucose, Bld 326 (H) 70 - 99 mg/dL   Calcium, Ion 1.09 (L) 1.13 - 1.30 mmol/L   TCO2 17 0 - 100 mmol/L   Hemoglobin 12.9 (L) 13.0 - 17.0 g/dL   HCT 38.0 (L) 39.0 - 52.0 %  CBC     Status: Abnormal   Collection Time: 02/01/2015  5:30 PM  Result Value Ref Range   WBC 16.8 (H) 4.0 - 10.5 K/uL   RBC 4.38 4.22 - 5.81 MIL/uL   Hemoglobin 11.7 (L) 13.0 - 17.0 g/dL   HCT 37.7 (L) 39.0 - 52.0 %   MCV 86.1 78.0 - 100.0 fL   MCH 26.7 26.0 - 34.0 pg   MCHC 31.0 30.0 - 36.0 g/dL   RDW 14.9 11.5 - 15.5 %   Platelets 187 150 - 400 K/uL  CK total and CKMB (cardiac)     Status: Abnormal   Collection Time: 02/04/2015  5:30 PM  Result Value Ref Range   Total CK 207 7 - 232 U/L   CK,  MB 17.0 (HH) 0.3 - 4.0 ng/mL    Comment: REPEATED TO VERIFY CRITICAL RESULT CALLED TO, READ BACK BY AND VERIFIED WITH: B ROBINSON,RCIS 1911 01/21/2015 D BRADLEY    Relative Index 8.2 (H) 0.0 - 2.5  Comprehensive metabolic panel     Status: Abnormal   Collection Time: 02/07/2015  5:30 PM  Result Value Ref Range   Sodium 140 135 - 145 mmol/L   Potassium 4.4 3.5 - 5.1 mmol/L   Chloride 108 96 - 112 mmol/L   CO2 20 19 - 32 mmol/L   Glucose, Bld 322 (H) 70 - 99 mg/dL   BUN 20 6 - 23 mg/dL   Creatinine, Ser 1.89 (H) 0.50 - 1.35 mg/dL   Calcium 7.9 (L) 8.4 - 10.5 mg/dL   Total Protein 5.2 (L) 6.0 - 8.3 g/dL   Albumin 3.0 (L) 3.5 - 5.2 g/dL   AST 189 (H) 0 - 37 U/L   ALT 112 (H) 0 - 53 U/L   Alkaline Phosphatase 119 (H) 39 - 117 U/L   Total Bilirubin 0.5 0.3 - 1.2 mg/dL   GFR calc non Af Amer 30 (L) >90 mL/min   GFR calc Af Amer 35 (L) >90 mL/min    Comment: (NOTE) The eGFR has been calculated using the CKD EPI equation. This calculation has not been validated in all clinical situations. eGFR's persistently <90 mL/min signify possible Chronic Kidney Disease.    Anion gap 12 5 - 15  Lipid panel     Status:  Abnormal   Collection Time: 01/21/2015  5:30 PM  Result Value Ref Range   Cholesterol 124 0 - 200 mg/dL   Triglycerides 237 (H) <150 mg/dL   HDL 29 (L) >39 mg/dL   Total CHOL/HDL Ratio 4.3 RATIO   VLDL 47 (H) 0 - 40 mg/dL   LDL Cholesterol 48 0 - 99 mg/dL    Comment:        Total Cholesterol/HDL:CHD Risk Coronary Heart Disease Risk Table                     Men   Women  1/2 Average Risk   3.4   3.3  Average Risk       5.0   4.4  2 X Average Risk   9.6   7.1  3 X Average Risk  23.4   11.0        Use the calculated Patient Ratio above and the CHD Risk Table to determine the patient's CHD Risk.        ATP III CLASSIFICATION (LDL):  <100     mg/dL   Optimal  100-129  mg/dL   Near or Above                    Optimal  130-159  mg/dL   Borderline  160-189  mg/dL   High  >190      mg/dL   Very High   Protime-INR     Status: Abnormal   Collection Time: 01/18/2015  5:30 PM  Result Value Ref Range   Prothrombin Time 16.4 (H) 11.6 - 15.2 seconds   INR 1.30 0.00 - 1.49  APTT     Status: Abnormal   Collection Time: 01/28/2015  5:30 PM  Result Value Ref Range   aPTT 99 (H) 24 - 37 seconds    Comment:        IF BASELINE aPTT IS ELEVATED, SUGGEST PATIENT RISK ASSESSMENT BE USED TO DETERMINE APPROPRIATE ANTICOAGULANT THERAPY.   Troponin I     Status: Abnormal   Collection Time: 01/17/2015  5:30 PM  Result Value Ref Range   Troponin I 0.58 (HH) <0.031 ng/mL    Comment:        POSSIBLE MYOCARDIAL ISCHEMIA. SERIAL TESTING RECOMMENDED. REPEATED TO VERIFY CRITICAL RESULT CALLED TO, READ BACK BY AND VERIFIED WITH: B ROBINSON,RCIS 1911 01/21/2015 D BRADLEY   I-STAT 3, arterial blood gas (G3+)     Status: Abnormal   Collection Time: 01/14/2015  6:17 PM  Result Value Ref Range   pH, Arterial 7.146 (LL) 7.350 - 7.450   pCO2 arterial 46.6 (H) 35.0 - 45.0 mmHg   pO2, Arterial 86.0 80.0 - 100.0 mmHg   Bicarbonate 16.1 (L) 20.0 - 24.0 mEq/L   TCO2 17 0 - 100 mmol/L   O2 Saturation 93.0 %   Acid-base deficit 13.0 (H) 0.0 - 2.0 mmol/L   Sample type ARTERIAL    Comment NOTIFIED PHYSICIAN   Glucose, capillary     Status: Abnormal   Collection Time: 01/28/2015  8:12 PM  Result Value Ref Range   Glucose-Capillary 263 (H) 70 - 99 mg/dL   Comment 1 Capillary Specimen   CBC     Status: Abnormal   Collection Time: 01/28/2015  8:30 PM  Result Value Ref Range   WBC 17.2 (H) 4.0 - 10.5 K/uL   RBC 4.67 4.22 - 5.81 MIL/uL   Hemoglobin 12.4 (L) 13.0 - 17.0 g/dL   HCT 38.8 (  L) 39.0 - 52.0 %   MCV 83.1 78.0 - 100.0 fL   MCH 26.6 26.0 - 34.0 pg   MCHC 32.0 30.0 - 36.0 g/dL   RDW 15.0 11.5 - 15.5 %   Platelets 213 150 - 400 K/uL  Creatinine, serum     Status: Abnormal   Collection Time: 02/02/2015  8:30 PM  Result Value Ref Range   Creatinine, Ser 1.83 (H) 0.50 - 1.35 mg/dL   GFR calc non  Af Amer 31 (L) >90 mL/min   GFR calc Af Amer 36 (L) >90 mL/min    Comment: (NOTE) The eGFR has been calculated using the CKD EPI equation. This calculation has not been validated in all clinical situations. eGFR's persistently <90 mL/min signify possible Chronic Kidney Disease.   Troponin I (serum)     Status: Abnormal   Collection Time: 01/28/2015  8:30 PM  Result Value Ref Range   Troponin I 10.34 (HH) <0.031 ng/mL    Comment:        POSSIBLE MYOCARDIAL ISCHEMIA. SERIAL TESTING RECOMMENDED. REPEATED TO VERIFY CRITICAL RESULT CALLED TO, READ BACK BY AND VERIFIED WITH: J. PERRKINS RN  [phone] GREEN R   CK total and CKMB     Status: Abnormal   Collection Time: 02/04/2015  8:30 PM  Result Value Ref Range   Total CK 1155 (H) 7 - 232 U/L   CK, MB 194.4 (HH) 0.3 - 4.0 ng/mL    Comment: REPEATED TO VERIFY CRITICAL RESULT CALLED TO, READ BACK BY AND VERIFIED WITH: J. PERRKINS RN [phone] GREEN R    Relative Index 16.8 (H) 0.0 - 2.5  Troponin I     Status: Abnormal   Collection Time: 01/18/2015  8:30 PM  Result Value Ref Range   Troponin I 9.72 (HH) <0.031 ng/mL    Comment:        POSSIBLE MYOCARDIAL ISCHEMIA. SERIAL TESTING RECOMMENDED. REPEATED TO VERIFY CRITICAL RESULT CALLED TO, READ BACK BY AND VERIFIED WITH: J. PERRKINS RN [phone] GREEN R   Basic metabolic panel     Status: Abnormal   Collection Time: 02/04/2015  8:30 PM  Result Value Ref Range   Sodium 138 135 - 145 mmol/L   Potassium 5.1 3.5 - 5.1 mmol/L   Chloride 104 96 - 112 mmol/L   CO2 18 (L) 19 - 32 mmol/L   Glucose, Bld 370 (H) 70 - 99 mg/dL   BUN 26 (H) 6 - 23 mg/dL   Creatinine, Ser 1.90 (H) 0.50 - 1.35 mg/dL   Calcium 7.6 (L) 8.4 - 10.5 mg/dL   GFR calc non Af Amer 30 (L) >90 mL/min   GFR calc Af Amer 34 (L) >90 mL/min    Comment: (NOTE) The eGFR has been calculated using the CKD EPI equation. This calculation has not been validated in all clinical situations. eGFR's persistently <90 mL/min  signify possible Chronic Kidney Disease.    Anion gap 16 (H) 5 - 15  Protime-INR now and repeat in 8 hours     Status: Abnormal   Collection Time: 01/10/2015  8:30 PM  Result Value Ref Range   Prothrombin Time 73.6 (H) 11.6 - 15.2 seconds    Comment: REPEATED TO VERIFY   INR 8.94 (HH) 0.00 - 1.49    Comment: REPEATED TO VERIFY CRITICAL RESULT CALLED TO, READ BACK BY AND VERIFIED WITH: B.PERKINS,RN 2209 01/08/2015 M.CAMPBELL   APTT now and repeat in 8 hours     Status: Abnormal  Collection Time: 01/24/2015  8:30 PM  Result Value Ref Range   aPTT >200 (HH) 24 - 37 seconds    Comment:        IF BASELINE aPTT IS ELEVATED, SUGGEST PATIENT RISK ASSESSMENT BE USED TO DETERMINE APPROPRIATE ANTICOAGULANT THERAPY. REPEATED TO VERIFY CRITICAL RESULT CALLED TO, READ BACK BY AND VERIFIED WITH: B.PERKINS,RN 2209 01/22/2015 M.CAMPBELL   I-STAT 3, arterial blood gas (G3+)     Status: Abnormal   Collection Time: 01/28/2015  8:52 PM  Result Value Ref Range   pH, Arterial 7.288 (L) 7.350 - 7.450   pCO2 arterial 34.7 (L) 35.0 - 45.0 mmHg   pO2, Arterial 68.0 (L) 80.0 - 100.0 mmHg   Bicarbonate 17.2 (L) 20.0 - 24.0 mEq/L   TCO2 18 0 - 100 mmol/L   O2 Saturation 94.0 %   Acid-base deficit 9.0 (H) 0.0 - 2.0 mmol/L   Patient temperature 34.3 C    Sample type ARTERIAL   MRSA PCR Screening     Status: Abnormal   Collection Time: 01/18/2015  8:55 PM  Result Value Ref Range   MRSA by PCR POSITIVE (A) NEGATIVE    Comment:        The GeneXpert MRSA Assay (FDA approved for NASAL specimens only), is one component of a comprehensive MRSA colonization surveillance program. It is not intended to diagnose MRSA infection nor to guide or monitor treatment for MRSA infections. RESULT CALLED TO, READ BACK BY AND VERIFIED WITH: CALLED TO RN [place] PERKINS [phone] @0041  THANEY   Glucose, capillary     Status: Abnormal   Collection Time: 01/23/2015  9:54 PM  Result Value Ref Range   Glucose-Capillary 354 (H) 70 -  99 mg/dL  Basic metabolic panel     Status: Abnormal   Collection Time: 02/01/2015 10:28 PM  Result Value Ref Range   Sodium 138 135 - 145 mmol/L   Potassium 5.0 3.5 - 5.1 mmol/L   Chloride 104 96 - 112 mmol/L   CO2 19 19 - 32 mmol/L   Glucose, Bld 381 (H) 70 - 99 mg/dL   BUN 27 (H) 6 - 23 mg/dL   Creatinine, Ser 1.97 (H) 0.50 - 1.35 mg/dL   Calcium 7.6 (L) 8.4 - 10.5 mg/dL   GFR calc non Af Amer 28 (L) >90 mL/min   GFR calc Af Amer 33 (L) >90 mL/min    Comment: (NOTE) The eGFR has been calculated using the CKD EPI equation. This calculation has not been validated in all clinical situations. eGFR's persistently <90 mL/min signify possible Chronic Kidney Disease.    Anion gap 15 5 - 15  Protime-INR     Status: Abnormal   Collection Time: 01/27/2015 10:36 PM  Result Value Ref Range   Prothrombin Time 33.5 (H) 11.6 - 15.2 seconds   INR 3.26 (H) 0.00 - 1.49  Glucose, capillary     Status: Abnormal   Collection Time: 01/13/2015 10:41 PM  Result Value Ref Range   Glucose-Capillary 286 (H) 70 - 99 mg/dL   Comment 1 Arterial Specimen   I-STAT 3, arterial blood gas (G3+)     Status: Abnormal   Collection Time: 01/11/2015 10:45 PM  Result Value Ref Range   pH, Arterial 7.329 (L) 7.350 - 7.450   pCO2 arterial 29.7 (L) 35.0 - 45.0 mmHg   pO2, Arterial 99.0 80.0 - 100.0 mmHg   Bicarbonate 16.2 (L) 20.0 - 24.0 mEq/L   TCO2 17 0 - 100 mmol/L   O2 Saturation  98.0 %   Acid-base deficit 9.0 (H) 0.0 - 2.0 mmol/L   Patient temperature 33.8 C    Sample type ARTERIAL   Lactic acid, plasma     Status: Abnormal   Collection Time: 01/28/2015 11:00 PM  Result Value Ref Range   Lactic Acid, Venous 5.3 (HH) 0.5 - 2.0 mmol/L    Comment: REPEATED TO VERIFY CRITICAL RESULT CALLED TO, READ BACK BY AND VERIFIED WITH: J. PERRKINS RN [phone] GREEN R   Glucose, capillary     Status: Abnormal   Collection Time: 01/16/2015 11:51 PM  Result Value Ref Range   Glucose-Capillary 392 (H) 70 - 99 mg/dL   Comment  1 Arterial Specimen   Basic metabolic panel     Status: Abnormal   Collection Time: 01/09/2015 11:59 PM  Result Value Ref Range   Sodium 136 135 - 145 mmol/L   Potassium 4.4 3.5 - 5.1 mmol/L   Chloride 105 96 - 112 mmol/L   CO2 18 (L) 19 - 32 mmol/L   Glucose, Bld 411 (H) 70 - 99 mg/dL   BUN 27 (H) 6 - 23 mg/dL   Creatinine, Ser 1.99 (H) 0.50 - 1.35 mg/dL   Calcium 7.3 (L) 8.4 - 10.5 mg/dL   GFR calc non Af Amer 28 (L) >90 mL/min   GFR calc Af Amer 33 (L) >90 mL/min    Comment: (NOTE) The eGFR has been calculated using the CKD EPI equation. This calculation has not been validated in all clinical situations. eGFR's persistently <90 mL/min signify possible Chronic Kidney Disease.    Anion gap 13 5 - 15  Magnesium     Status: None   Collection Time: 01/30/15 12:08 AM  Result Value Ref Range   Magnesium 1.9 1.5 - 2.5 mg/dL  Glucose, capillary     Status: Abnormal   Collection Time: 01/30/15 12:59 AM  Result Value Ref Range   Glucose-Capillary 369 (H) 70 - 99 mg/dL   Comment 1 Arterial Specimen   Glucose, capillary     Status: Abnormal   Collection Time: 01/30/15  1:54 AM  Result Value Ref Range   Glucose-Capillary 320 (H) 70 - 99 mg/dL   Comment 1 Arterial Specimen   I-STAT 3, arterial blood gas (G3+)     Status: Abnormal   Collection Time: 01/30/15  2:39 AM  Result Value Ref Range   pH, Arterial 7.186 (LL) 7.350 - 7.450   pCO2 arterial 37.8 35.0 - 45.0 mmHg   pO2, Arterial 58.0 (L) 80.0 - 100.0 mmHg   Bicarbonate 15.2 (L) 20.0 - 24.0 mEq/L   TCO2 17 0 - 100 mmol/L   O2 Saturation 90.0 %   Acid-base deficit 14.0 (H) 0.0 - 2.0 mmol/L   Patient temperature 32.6 C    Sample type ARTERIAL   Glucose, capillary     Status: Abnormal   Collection Time: 01/30/15  3:04 AM  Result Value Ref Range   Glucose-Capillary 315 (H) 70 - 99 mg/dL   Comment 1 Arterial Specimen   CBC     Status: Abnormal   Collection Time: 01/30/15  3:15 AM  Result Value Ref Range   WBC 11.5 (H) 4.0 - 10.5  K/uL   RBC 4.32 4.22 - 5.81 MIL/uL   Hemoglobin 11.3 (L) 13.0 - 17.0 g/dL   HCT 35.5 (L) 39.0 - 52.0 %   MCV 82.2 78.0 - 100.0 fL   MCH 26.2 26.0 - 34.0 pg   MCHC 31.8 30.0 - 36.0 g/dL  RDW 14.9 11.5 - 15.5 %   Platelets 181 150 - 400 K/uL  Troponin I (serum)     Status: Abnormal   Collection Time: 01/30/15  3:15 AM  Result Value Ref Range   Troponin I 32.43 (HH) <0.031 ng/mL    Comment:        POSSIBLE MYOCARDIAL ISCHEMIA. SERIAL TESTING RECOMMENDED. REPEATED TO VERIFY CRITICAL VALUE NOTED.  VALUE IS CONSISTENT WITH PREVIOUSLY REPORTED AND CALLED VALUE.   Basic metabolic panel     Status: Abnormal   Collection Time: 01/30/15  3:15 AM  Result Value Ref Range   Sodium 141 135 - 145 mmol/L   Potassium 3.4 (L) 3.5 - 5.1 mmol/L    Comment: DELTA CHECK NOTED   Chloride 104 96 - 112 mmol/L   CO2 19 19 - 32 mmol/L   Glucose, Bld 308 (H) 70 - 99 mg/dL   BUN 27 (H) 6 - 23 mg/dL   Creatinine, Ser 2.06 (H) 0.50 - 1.35 mg/dL   Calcium 7.3 (L) 8.4 - 10.5 mg/dL   GFR calc non Af Amer 27 (L) >90 mL/min   GFR calc Af Amer 31 (L) >90 mL/min    Comment: (NOTE) The eGFR has been calculated using the CKD EPI equation. This calculation has not been validated in all clinical situations. eGFR's persistently <90 mL/min signify possible Chronic Kidney Disease.    Anion gap 18 (H) 5 - 15  Protime-INR now and repeat in 8 hours     Status: Abnormal   Collection Time: 01/30/15  3:15 AM  Result Value Ref Range   Prothrombin Time 22.2 (H) 11.6 - 15.2 seconds   INR 1.93 (H) 0.00 - 1.49  APTT now and repeat in 8 hours     Status: Abnormal   Collection Time: 01/30/15  3:15 AM  Result Value Ref Range   aPTT 55 (H) 24 - 37 seconds    Comment:        IF BASELINE aPTT IS ELEVATED, SUGGEST PATIENT RISK ASSESSMENT BE USED TO DETERMINE APPROPRIATE ANTICOAGULANT THERAPY.   Hepatic function panel     Status: Abnormal   Collection Time: 01/30/15  3:15 AM  Result Value Ref Range   Total Protein 4.2  (L) 6.0 - 8.3 g/dL   Albumin 2.7 (L) 3.5 - 5.2 g/dL   AST 256 (H) 0 - 37 U/L   ALT 112 (H) 0 - 53 U/L   Alkaline Phosphatase 86 39 - 117 U/L   Total Bilirubin 0.6 0.3 - 1.2 mg/dL   Bilirubin, Direct 0.1 0.0 - 0.5 mg/dL   Indirect Bilirubin 0.5 0.3 - 0.9 mg/dL  Lactic acid, plasma     Status: Abnormal   Collection Time: 01/30/15  3:23 AM  Result Value Ref Range   Lactic Acid, Venous 8.5 (HH) 0.5 - 2.0 mmol/L    Comment: REPEATED TO VERIFY CRITICAL RESULT CALLED TO, READ BACK BY AND VERIFIED WITH: J. PERRKINS RN [phone] GREEN R   I-STAT 3, arterial blood gas (G3+)     Status: Abnormal   Collection Time: 01/30/15  3:34 AM  Result Value Ref Range   pH, Arterial 7.424 7.350 - 7.450   pCO2 arterial 21.8 (L) 35.0 - 45.0 mmHg   pO2, Arterial 56.0 (L) 80.0 - 100.0 mmHg   Bicarbonate 14.8 (L) 20.0 - 24.0 mEq/L   TCO2 16 0 - 100 mmol/L   O2 Saturation 94.0 %   Acid-base deficit 9.0 (H) 0.0 - 2.0 mmol/L   Patient  temperature 33.2 C    Sample type ARTERIAL   Glucose, capillary     Status: Abnormal   Collection Time: 01/30/15  4:02 AM  Result Value Ref Range   Glucose-Capillary 297 (H) 70 - 99 mg/dL   Comment 1 Capillary Specimen   I-STAT 3, arterial blood gas (G3+)     Status: Abnormal   Collection Time: 01/30/15  4:43 AM  Result Value Ref Range   pH, Arterial 7.318 (L) 7.350 - 7.450   pCO2 arterial 30.0 (L) 35.0 - 45.0 mmHg   pO2, Arterial 64.0 (L) 80.0 - 100.0 mmHg   Bicarbonate 16.1 (L) 20.0 - 24.0 mEq/L   TCO2 17 0 - 100 mmol/L   O2 Saturation 94.0 %   Acid-base deficit 10.0 (H) 0.0 - 2.0 mmol/L   Patient temperature 33.4 C    Sample type ARTERIAL   Glucose, capillary     Status: Abnormal   Collection Time: 01/30/15  5:04 AM  Result Value Ref Range   Glucose-Capillary 277 (H) 70 - 99 mg/dL  Carboxyhemoglobin     Status: Abnormal   Collection Time: 01/30/15  5:45 AM  Result Value Ref Range   Total hemoglobin 12.5 (L) 13.5 - 18.0 g/dL   O2 Saturation 81.7 %    Carboxyhemoglobin 0.4 (L) 0.5 - 1.5 %   Methemoglobin 0.8 0.0 - 1.5 %  Glucose, capillary     Status: Abnormal   Collection Time: 01/30/15  6:10 AM  Result Value Ref Range   Glucose-Capillary 240 (H) 70 - 99 mg/dL   Comment 1 Arterial Specimen   Procalcitonin - Baseline     Status: None   Collection Time: 01/30/15  6:15 AM  Result Value Ref Range   Procalcitonin 2.36 ng/mL    Comment:        Interpretation: PCT > 2 ng/mL: Systemic infection (sepsis) is likely, unless other causes are known. (NOTE)         ICU PCT Algorithm               Non ICU PCT Algorithm    ----------------------------     ------------------------------         PCT < 0.25 ng/mL                 PCT < 0.1 ng/mL     Stopping of antibiotics            Stopping of antibiotics       strongly encouraged.               strongly encouraged.    ----------------------------     ------------------------------       PCT level decrease by               PCT < 0.25 ng/mL       >= 80% from peak PCT       OR PCT 0.25 - 0.5 ng/mL          Stopping of antibiotics                                             encouraged.     Stopping of antibiotics           encouraged.    ----------------------------     ------------------------------       PCT level decrease by  PCT >= 0.25 ng/mL       < 80% from peak PCT        AND PCT >= 0.5 ng/mL            Continuing antibiotics                                               encouraged.       Continuing antibiotics            encouraged.    ----------------------------     ------------------------------     PCT level increase compared          PCT > 0.5 ng/mL         with peak PCT AND          PCT >= 0.5 ng/mL             Escalation of antibiotics                                          strongly encouraged.      Escalation of antibiotics        strongly encouraged.   Basic metabolic panel     Status: Abnormal   Collection Time: 01/30/15  6:15 AM  Result Value Ref Range     Sodium 141 135 - 145 mmol/L   Potassium 2.8 (L) 3.5 - 5.1 mmol/L   Chloride 108 96 - 112 mmol/L   CO2 20 19 - 32 mmol/L   Glucose, Bld 266 (H) 70 - 99 mg/dL   BUN 28 (H) 6 - 23 mg/dL   Creatinine, Ser 2.06 (H) 0.50 - 1.35 mg/dL   Calcium 7.3 (L) 8.4 - 10.5 mg/dL   GFR calc non Af Amer 27 (L) >90 mL/min   GFR calc Af Amer 31 (L) >90 mL/min    Comment: (NOTE) The eGFR has been calculated using the CKD EPI equation. This calculation has not been validated in all clinical situations. eGFR's persistently <90 mL/min signify possible Chronic Kidney Disease.    Anion gap 13 5 - 15  Glucose, capillary     Status: Abnormal   Collection Time: 01/30/15  7:15 AM  Result Value Ref Range   Glucose-Capillary 231 (H) 70 - 99 mg/dL  Troponin I (serum)     Status: Abnormal   Collection Time: 01/30/15  8:29 AM  Result Value Ref Range   Troponin I 31.83 (HH) <0.031 ng/mL    Comment:        POSSIBLE MYOCARDIAL ISCHEMIA. SERIAL TESTING RECOMMENDED. REPEATED TO VERIFY CRITICAL VALUE NOTED.  VALUE IS CONSISTENT WITH PREVIOUSLY REPORTED AND CALLED VALUE.     Imaging: Dg Chest Port 1 View  01/30/2015   CLINICAL DATA:  Central line placement.  Initial encounter.  EXAM: PORTABLE CHEST - 1 VIEW  COMPARISON:  Chest radiograph performed earlier today at 9:39 p.m.  FINDINGS: The patient's left IJ line is noted ending overlying the proximal to mid SVC.  The endotracheal tube is seen ending 2-3 cm above the carina. An enteric tube is noted extending below the diaphragm.  Increased interstitial markings are seen, more prominent on the right. This is mildly improved from the prior study, and may reflect pneumonia or asymmetric interstitial edema. No pleural  effusion or pneumothorax is seen.  The cardiomediastinal silhouette is mildly enlarged. No acute osseous abnormalities are identified.  IMPRESSION: 1. Left IJ line noted ending overlying the proximal to mid SVC. 2. Endotracheal tube seen ending 2-3 cm above the  carina. 3. Mild interval improvement in increased interstitial markings, more prominent on the right. This may reflect pneumonia or asymmetric interstitial edema. 4. Mild cardiomegaly noted.   Electronically Signed   By: Garald Balding M.D.   On: 01/30/2015 00:28   Dg Chest Port 1 View  01/08/2015   CLINICAL DATA:  Central line placement  EXAM: PORTABLE CHEST - 1 VIEW  COMPARISON:  06/04/2008  FINDINGS: Endotracheal tube terminates 3 cm above the carina.  Left IJ venous catheter terminates in the mid SVC.  Patchy right upper lobe opacity. In the appropriate clinical setting, this is suspicious for pneumonia.  Possible superimposed mild interstitial edema. No pleural effusion or pneumothorax.  Cardiomegaly.  IMPRESSION: Endotracheal tube terminates 3 cm above the carina.  Left IJ venous catheter terminates in the mid SVC.  No pneumothorax.  Patchy right upper lobe opacity. In the appropriate clinical setting, suspicious for pneumonia.  Cardiomegaly.  Possible superimposed mild interstitial edema.   Electronically Signed   By: Julian Hy M.D.   On: 01/30/2015 21:59   Dg Abd Portable 1v  01/30/2015   CLINICAL DATA:  NG tube insertion  EXAM: PORTABLE ABDOMEN - 1 VIEW  COMPARISON:  None.  FINDINGS: Enteric tube is looped in the stomach and terminates in the gastric antrum.  Operative bowel gas pattern.  Moderate right colonic stool burden.  IMPRESSION: Enteric tube is looped in the stomach and terminates in the gastric antrum.   Electronically Signed   By: Julian Hy M.D.   On: 01/30/2015 20:58    Assessment:  1. Principal Problem: 2.   ST elevation myocardial infarction (STEMI) of anterior wall, initial episode of care 3. Active Problems: 4.   Cardiac arrest - in setting of Anterior STEMI 5.   CAD S/P PCI to Prox LAD Promus Premier DES 3.5 mm x 20 mm (~3.9 mm) 6.   Cardiogenic shock 7.   Acute respiratory failure with hypoxia 8.   Shock circulatory 9.   Acute kidney  injury 10.   Plan:  1. STEMI - critically ill with multiple pressors at this point, on arctic sun protocol. There is difficulty maintaining MAP's of 80. LVEF is 25% at best with LAD infarct. Recent Co-ox was in the 80's, suggesting that this may be a mixed cardiogenic/septic shock picture - although, would have to believe cardiogenic shock is the clear mechanism. Would recommend repeating the co-ox. Ultimately, a swan-ganz catheter may be helpful to measure and follow CO/CI. IABP would be contraindicated due a tortuous aorta - impella may be a possibility if there is further hemodynamic decline. Discussed with Dr. Chase Caller, will switch to normothermia arm - come off paralytics and allow higher BP. Noted that he was not ordered for aspirin today- I have added 81 mg ASA. Continue Brillinta due to stent yesterday.  Prognosis remains guarded, but at this point, he is full code.  CRITICAL CARE:  The patient is critically ill with multi-organ system failure and requires high complexity decision making for assessment and support, frequent evaluation and titration of therapies, application of advanced monitoring technologies and extensive interpretation of multiple databases.  Time Spent Directly with Patient:  45 minutes  Length of Stay:  LOS: 1 day   Pixie Casino, MD,  [facility] Attending Cardiologist [facility]  Zuley Lutter C 01/30/2015, 10:34 AM

## 2015-01-30 NOTE — Progress Notes (Signed)
Critical PT/INR called to Dr. Radford Pax. Orders received for STAT repeat.

## 2015-01-30 NOTE — Progress Notes (Signed)
[place] Progress Note Patient Name: Jeremy Mcdaniel DOB: Dec 22, 1922 MRN: [id number]   Date of Service  01/30/2015  HPI/Events of Note  eeg neg focus Appears as myoclonus  eICU Interventions  Max benzo, may need depakote     Intervention Category Major Interventions: Change in mental status - evaluation and management  Raylene Miyamoto. 01/30/2015, 8:39 PM

## 2015-01-30 NOTE — Progress Notes (Signed)
[place] Progress Note Patient Name: DANZEL MARSZALEK DOB: [birth year] MRN: [id number]   Date of Service  01/30/2015  HPI/Events of Note  Mag of 1.9 in the setting of frequent PVCs  eICU Interventions  Mag replaced     Intervention Category Intermediate Interventions: Electrolyte abnormality - evaluation and management  Adoria Kawamoto 01/30/2015, 2:03 AM

## 2015-01-30 NOTE — Progress Notes (Signed)
Order for sheath removal verified per post procedural orders. Procedure explained to patient and Rt femoral artery access site assessed: level 0, but dark brussing noticed around site from the night before. Palpable dorsalis pedis and posterior tibial pulses. 6French Sheath removed and manual pressure applied for 25 minutes. Pre, peri, & post procedural vitals: HR 80, RR 12 assisted, O2 Sat upper 90, BP 138/82, Pain 0. Distal pulses remained intact after sheath removal. Access site level 0 but still brussing noted around site, dressed with 4X4 gauze and tegaderm. RN confirmed condition of site. Pt intubated and sedated, so no post instructions were able to be given.

## 2015-01-30 NOTE — Care Management Note (Signed)
    Page 1 of 1   01/30/2015     9:31:31 AM CARE MANAGEMENT NOTE 01/30/2015  Patient:  Jeremy Mcdaniel, Jeremy Mcdaniel   Account Number:  [id number]  Date Initiated:  01/30/2015  Documentation initiated by:  Elissa Hefty  Subjective/Objective Assessment:   adm w stemi, vent     Action/Plan:   lives w wife, pcp dr Murvin Donning   Anticipated DC Date:     Anticipated DC Plan:        [place]  CM consult  Medication Assistance      Choice offered to / List presented to:             Status of service:   Medicare Important Message given?   (If response is "NO", the following Medicare IM given date fields will be blank) Date Medicare IM given:   Medicare IM given by:   Date Additional Medicare IM given:   Additional Medicare IM given by:    Discharge Disposition:    Per UR Regulation:  Reviewed for med. necessity/level of care/duration of stay  If discussed at [place] of Stay Meetings, dates discussed:    Comments:  3/23 0930 debbie Donalda Job rn,bsn gave pt 30day free brilinta card. will have 40.00 per month copay.

## 2015-01-30 NOTE — Progress Notes (Signed)
Foley inserted in pt per order by two RNs.  Sterile technique was used. Peri care was done prior to insertion of foley.

## 2015-01-30 NOTE — Progress Notes (Signed)
Right femoral groin with palpable hemotoma. Pressure applied with sand bag x1hr. Site soft at this time. R DP pulse dopplerable

## 2015-01-30 NOTE — Progress Notes (Signed)
eLink Physician-Brief Progress Note Patient Name: Jeremy Mcdaniel DOB: 11/05/1923 MRN: [id number]   Date of Service  01/30/2015  HPI/Events of Note  Patient with hypotension in the setting of worsening metabolic acidosis.  BP in the 15W systolic with pH 4.13/64/38/37  eICU Interventions  Plan: 2 amps of bicarb IVP Change vent RR to 35 Recheck ABG in 30 minutes Add NEO gtt for BP support as needed PCCM to see patient at bedside Consider bicarb gtt after ABG     Intervention Category Major Interventions: Acid-Base disturbance - evaluation and management;Hypertension - evaluation and management  Evaluna Utke 01/30/2015, 2:59 AM

## 2015-01-30 NOTE — Progress Notes (Signed)
Echocardiogram 2D Echocardiogram has been performed.  Jeremy Mcdaniel 01/30/2015, 8:13 AM

## 2015-01-30 NOTE — Progress Notes (Signed)
PCCM Interval Progress Note  Called by Warren Lacy MD to assess pt at bedside for persistent hypotension despite vasopressin and max dose levophed.  HPI / Events:   Pt has been having PVC's over past hour or so and BP has been very sensitive to these.  Mag 1.9 - repleted. Latest ABG 7.18 / 37 / 78 / 15.   2 amps HCO3 given and SBP increased by 10 - 15.  In addition, RR on vent increased to 35 and Neosynephrine ordered but has not yet been started.  Repeat ABG ~35 min. post HCO3 pushes and vent changes:  7.42 / 21.8 / 72 / 14.8.  Interventions:   Decrease RR to 20. HCO3 infusion added.  Neosynephrine starting now. Continue to monitor closely.  CC time:  25 min.   Montey Hora, [place] [facility] Pager: [phone]  or [phone] 01/30/2015, 3:41 AM

## 2015-01-30 NOTE — Progress Notes (Signed)
Critical lactic acid called to elink.

## 2015-01-30 NOTE — Progress Notes (Signed)
Elink made aware of current bicarb on ABG.

## 2015-01-30 NOTE — Consult Note (Signed)
PULMONARY / CRITICAL CARE MEDICINE   Name: Jeremy Mcdaniel MRN: [id number] DOB: January 24, 1923    ADMISSION DATE:  01/24/2015 CONSULTATION DATE:  01/30/2015  REFERRING MD :  EDP  CHIEF COMPLAINT:  Cardiac arrest  INITIAL PRESENTATION:    81 year old male with PMH as below, which is significant for HTN, HLD, CLL, DM and CHF. He was outside at his house gardening when he suffered a witnessed cardiac arrest. Wife called EMS, 4-5 mins without CPR until their arrival. He was found to be in VF arrest. Underwent 20 mins ACLS epi x 4 and defibrillation. Total downtime estimated 25 mins. In ED he was found to have inferior ST elevation on EKG and was taken to cath lab. He had 100% LAD occlusion requiring PCI. During this he converted to VF twice, converting back to sinus with a single defibrillation each time. Post-procedurally he was taken to ICU on ventilator. PCCM primary.   STUDIES: and EVENTs CT head 3/22 > 3/22 Cardiac arrest > to cath lab 100% LAD occlusion > PCI > to ICU on vent    SUBJECTIVE:  01/30/15: On inducted hypothermia protocol. Worsening lactic acidosis and multiple pressors. - vasopressin, levophed, neo and bicarb gtt added overnight. This AM off neo. Leviphed at 38mcg. On amio gtt.  Echo ordered. SCVO2 88%   VITAL SIGNS: Temp:  [89.2 F (31.8 C)-94.3 F (34.6 C)] 90.9 F (32.7 C) (03/23 0700) Pulse Rate:  [54-158] 55 (03/23 0700) Resp:  [0-35] 20 (03/23 0700) BP: (71-152)/(36-120) 152/73 mmHg (03/23 0400) SpO2:  [80 %-100 %] 98 % (03/23 0700) Arterial Line BP: (76-171)/(45-82) 117/65 mmHg (03/23 0700) FiO2 (%):  [60 %-100 %] 60 % (03/23 0400) Weight:  [92.4 kg (203 lb 11.3 oz)] 92.4 kg (203 lb 11.3 oz) (03/22 1955) HEMODYNAMICS: CVP:  [8 mmHg-11 mmHg] 11 mmHg VENTILATOR SETTINGS: Vent Mode:  [-] PRVC FiO2 (%):  [60 %-100 %] 60 % Set Rate:  [14 bmp-25 bmp] 20 bmp Vt Set:  [500 mL-550 mL] 550 mL PEEP:  [5 cmH20] 5 cmH20 Plateau Pressure:  [21 cmH20-25 cmH20] 23  cmH20 INTAKE / OUTPUT:  Intake/Output Summary (Last 24 hours) at 01/30/15 0746 Last data filed at 01/30/15 4008  Gross per 24 hour  Intake 2727.01 ml  Output    925 ml  Net 1802.01 ml    PHYSICAL EXAMINATION: General: Elderly male, sedated, in NAD. Neuro: Sedated and paralyzed, AT goal temp HEENT: [place]/AT. PERRL, sclerae anicteric. Cardiovascular: RRR, no M/R/G.  Lungs: Respirations even - sync with vent Abdomen: BS x 4, soft, NT/ND.  Musculoskeletal: No gross deformities, no edema.  Skin: Intact, warm, no rashes.   LABS: PULMONARY  Recent Labs Lab 01/20/2015 2052 02/04/2015 2245 01/30/15 0239 01/30/15 0334 01/30/15 0443 01/30/15 0545  PHART 7.288* 7.329* 7.186* 7.424 7.318*  --   PCO2ART 34.7* 29.7* 37.8 21.8* 30.0*  --   PO2ART 68.0* 99.0 58.0* 56.0* 64.0*  --   HCO3 17.2* 16.2* 15.2* 14.8* 16.1*  --   TCO2 18 17 17 16 17   --   O2SAT 94.0 98.0 90.0 94.0 94.0 81.7    CBC  Recent Labs Lab 01/16/2015 1730 02/05/2015 2030 01/30/15 0315  HGB 11.7* 12.4* 11.3*  HCT 37.7* 38.8* 35.5*  WBC 16.8* 17.2* 11.5*  PLT 187 213 181    COAGULATION  Recent Labs Lab 01/17/2015 1730 01/11/2015 2030 01/08/2015 2236 01/30/15 0315  INR 1.30 8.94* 3.26* 1.93*    CARDIAC    Recent Labs Lab 01/12/2015 1730 01/26/2015  2030 01/30/15 0315  TROPONINI 0.58* 9.72*  10.34* 32.43*   No results for input(s): PROBNP in the last 168 hours.   CHEMISTRY  Recent Labs Lab 01/21/2015 1730 01/20/2015 2030 01/30/2015 2228 01/19/2015 2359 01/30/15 0008 01/30/15 0315  NA 140 138 138 136  --  141  K 4.4 5.1 5.0 4.4  --  3.4*  CL 108 104 104 105  --  104  CO2 20 18* 19 18*  --  19  GLUCOSE 322* 370* 381* 411*  --  308*  BUN 20 26* 27* 27*  --  27*  CREATININE 1.89* 1.90*  1.83* 1.97* 1.99*  --  2.06*  CALCIUM 7.9* 7.6* 7.6* 7.3*  --  7.3*  MG  --   --   --   --  1.9  --    Estimated Creatinine Clearance: 26.8 mL/min (by C-G formula based on Cr of 2.06).   LIVER  Recent Labs Lab  01/17/2015 1730 01/24/2015 2030 01/19/2015 2236 01/30/15 0315  AST 189*  --   --  256*  ALT 112*  --   --  112*  ALKPHOS 119*  --   --  86  BILITOT 0.5  --   --  0.6  PROT 5.2*  --   --  4.2*  ALBUMIN 3.0*  --   --  2.7*  INR 1.30 8.94* 3.26* 1.93*     INFECTIOUS  Recent Labs Lab 01/25/2015 2300 01/30/15 0323  LATICACIDVEN 5.3* 8.5*     ENDOCRINE CBG (last 3)   Recent Labs  01/30/15 0504 01/30/15 0610 01/30/15 0715  GLUCAP 277* 240* 231*         IMAGING x48h Dg Chest Port 1 View  01/30/2015   CLINICAL DATA:  Central line placement.  Initial encounter.  EXAM: PORTABLE CHEST - 1 VIEW  COMPARISON:  Chest radiograph performed earlier today at 9:39 p.m.  FINDINGS: The patient's left IJ line is noted ending overlying the proximal to mid SVC.  The endotracheal tube is seen ending 2-3 cm above the carina. An enteric tube is noted extending below the diaphragm.  Increased interstitial markings are seen, more prominent on the right. This is mildly improved from the prior study, and may reflect pneumonia or asymmetric interstitial edema. No pleural effusion or pneumothorax is seen.  The cardiomediastinal silhouette is mildly enlarged. No acute osseous abnormalities are identified.  IMPRESSION: 1. Left IJ line noted ending overlying the proximal to mid SVC. 2. Endotracheal tube seen ending 2-3 cm above the carina. 3. Mild interval improvement in increased interstitial markings, more prominent on the right. This may reflect pneumonia or asymmetric interstitial edema. 4. Mild cardiomegaly noted.   Electronically Signed   By: Garald Balding M.D.   On: 01/30/2015 00:28   Dg Chest Port 1 View  01/22/2015   CLINICAL DATA:  Central line placement  EXAM: PORTABLE CHEST - 1 VIEW  COMPARISON:  06/04/2008  FINDINGS: Endotracheal tube terminates 3 cm above the carina.  Left IJ venous catheter terminates in the mid SVC.  Patchy right upper lobe opacity. In the appropriate clinical setting, this is  suspicious for pneumonia.  Possible superimposed mild interstitial edema. No pleural effusion or pneumothorax.  Cardiomegaly.  IMPRESSION: Endotracheal tube terminates 3 cm above the carina.  Left IJ venous catheter terminates in the mid SVC.  No pneumothorax.  Patchy right upper lobe opacity. In the appropriate clinical setting, suspicious for pneumonia.  Cardiomegaly.  Possible superimposed mild interstitial edema.   Electronically  Signed   By: Julian Hy M.D.   On: 01/25/2015 21:59   Dg Abd Portable 1v  01/12/2015   CLINICAL DATA:  NG tube insertion  EXAM: PORTABLE ABDOMEN - 1 VIEW  COMPARISON:  None.  FINDINGS: Enteric tube is looped in the stomach and terminates in the gastric antrum.  Operative bowel gas pattern.  Moderate right colonic stool burden.  IMPRESSION: Enteric tube is looped in the stomach and terminates in the gastric antrum.   Electronically Signed   By: Julian Hy M.D.   On: 02/02/2015 20:58        ASSESSMENT / PLAN:  PULMONARY OETT 3/22 > A: Acute hypoxic respiratory failure following cardiac arrest   - does not meet Sbt criteria  P:   Full vent support, 8cc/kg. VAP bundle. Hold daily SBT while undergoing hypothermia protocol. ABG and CXR in AM.  CARDIOVASCULAR CVL L IJ 3/22 (pending) >>> A:  VF/VT arrest due to 100% LAD occlusion s/p PCI 3/22 Cardiogenic shock Hx HTN, HLD, grade 2 DD (EF 60%)   - ongoing severe circulatory shock; SCVO2 88%. ? Septic v Cardiogenuic. Worsening lactic acidosis   P:  Check cortisol, PCT Start empiric hydrocort after sending cortisol - dc if cortisol > 25 For now continue e hypothermia protocol. Continue amiodarone per cards. Levophed as needed to maintain goal MAP > 80. REPEAT ECHO - might need swan - d/w Dr Radford Pax of cards  RENAL A:   AKI At risk metabolic derangements during hypothermia   - worsening creat  P:   NS @ 75. Replace electrolytes as indicated. BMP q2hrs x 4.  GASTROINTESTINAL A:    Transaminitis - likely from shock liver GI prophylaxis Nutrition   - likely mild shock liver  P:   Trend LFT's. SUP: Pantoprazole. NPO. Nutrition consult for TF's.  HEMATOLOGIC A:   Mild anemia VTE prophylaxis P:  Transfuse for Hgb < 8 in setting of MI SCD's / Heparin. Coags q8hrs. CBC in AM.  INFECTIOUS No results for input(s): PROCALCITON in the last 168 hours.  A:  Possible Pneumonia Sepsis. MRSA PCR positive  P:   Check urine leg and strep Check nasal flu pcr Check Trach aspirate  Multiplex PCR for resp virus Check PCT Check trach aspirate for bacteria Rx Vanc and Imipnem empiric Monitor clinically.  ENDOCRINE A:   DM   P:   ICU hyperglycemia protocol.  NEUROLOGIC A:   Acute metabolic encephalopathy Hx TIA  - at risk for anoxic encephalopaty   P:   Induced hypothermia Rx Sedation:  Fentanyl gtt / Midazolam gtt / Nimbex. RASS goal: - 5., BIS score < 50 Hold daily WUA while undergoing hypothermia protocol.   FAMILY  - Updates: children at bedside udpated 01/30/2015   - Inter-disciplinary family meet or Palliative Care meeting due by:  3/28.    The patient is critically ill with multiple organ systems failure and requires high complexity decision making for assessment and support, frequent evaluation and titration of therapies, application of advanced monitoring technologies and extensive interpretation of multiple databases.   Critical Care Time devoted to patient care services described in this note is  40  Minutes. This time reflects time of care of this signee Dr Brand Males. This critical care time does not reflect procedure time, or teaching time or supervisory time of PA/NP/Med student/Med Resident etc but could involve care discussion time    Dr. Brand Males, M.D., [facility].C.P Pulmonary and Critical Care Medicine Staff Physician [facility] [place] [facility] Pager: [phone], If no answer or between   15:00h - 7:00h: call [phone]  01/30/2015 7:46 AM

## 2015-01-31 ENCOUNTER — Inpatient Hospital Stay (HOSPITAL_COMMUNITY): Payer: Medicare Other

## 2015-01-31 DIAGNOSIS — N179 Acute kidney failure, unspecified: Secondary | ICD-10-CM

## 2015-01-31 DIAGNOSIS — Z66 Do not resuscitate: Secondary | ICD-10-CM

## 2015-01-31 LAB — CBC WITH DIFFERENTIAL/PLATELET
BASOS ABS: 0 10*3/uL (ref 0.0–0.1)
Basophils Relative: 0 % (ref 0–1)
Eosinophils Absolute: 0 10*3/uL (ref 0.0–0.7)
Eosinophils Relative: 0 % (ref 0–5)
HCT: 34.6 % — ABNORMAL LOW (ref 39.0–52.0)
Hemoglobin: 11.1 g/dL — ABNORMAL LOW (ref 13.0–17.0)
LYMPHS ABS: 0.9 10*3/uL (ref 0.7–4.0)
Lymphocytes Relative: 9 % — ABNORMAL LOW (ref 12–46)
MCH: 26.1 pg (ref 26.0–34.0)
MCHC: 32.1 g/dL (ref 30.0–36.0)
MCV: 81.4 fL (ref 78.0–100.0)
Monocytes Absolute: 1 10*3/uL (ref 0.1–1.0)
Monocytes Relative: 9 % (ref 3–12)
NEUTROS ABS: 8.5 10*3/uL — AB (ref 1.7–7.7)
NEUTROS PCT: 82 % — AB (ref 43–77)
PLATELETS: 126 10*3/uL — AB (ref 150–400)
RBC: 4.25 MIL/uL (ref 4.22–5.81)
RDW: 15.4 % (ref 11.5–15.5)
WBC: 10.5 10*3/uL (ref 4.0–10.5)

## 2015-01-31 LAB — GLUCOSE, CAPILLARY
GLUCOSE-CAPILLARY: 92 mg/dL (ref 70–99)
GLUCOSE-CAPILLARY: 70 mg/dL (ref 70–99)
GLUCOSE-CAPILLARY: 138 mg/dL — AB (ref 70–99)
GLUCOSE-CAPILLARY: 89 mg/dL (ref 70–99)
GLUCOSE-CAPILLARY: 69 mg/dL — AB (ref 70–99)
Glucose-Capillary: 32 mg/dL — CL (ref 70–99)
Glucose-Capillary: 56 mg/dL — ABNORMAL LOW (ref 70–99)
Glucose-Capillary: 69 mg/dL — ABNORMAL LOW (ref 70–99)
Glucose-Capillary: 70 mg/dL (ref 70–99)
Glucose-Capillary: 72 mg/dL (ref 70–99)
Glucose-Capillary: 78 mg/dL (ref 70–99)
Glucose-Capillary: 97 mg/dL (ref 70–99)

## 2015-01-31 LAB — BASIC METABOLIC PANEL
ANION GAP: 19 — AB (ref 5–15)
Anion gap: 16 — ABNORMAL HIGH (ref 5–15)
BUN: 31 mg/dL — AB (ref 6–23)
BUN: 35 mg/dL — ABNORMAL HIGH (ref 6–23)
CALCIUM: 7.2 mg/dL — AB (ref 8.4–10.5)
CO2: 18 mmol/L — ABNORMAL LOW (ref 19–32)
CO2: 17 mmol/L — AB (ref 19–32)
Calcium: 6.7 mg/dL — ABNORMAL LOW (ref 8.4–10.5)
Chloride: 107 mmol/L (ref 96–112)
Chloride: 103 mmol/L (ref 96–112)
Creatinine, Ser: 2.35 mg/dL — ABNORMAL HIGH (ref 0.50–1.35)
Creatinine, Ser: 2.65 mg/dL — ABNORMAL HIGH (ref 0.50–1.35)
GFR calc Af Amer: 23 mL/min — ABNORMAL LOW (ref >90)
GFR calc non Af Amer: 20 mL/min — ABNORMAL LOW (ref >90)
GFR, EST AFRICAN AMERICAN: 27 mL/min — AB (ref >90)
GFR, EST NON AFRICAN AMERICAN: 23 mL/min — AB (ref >90)
Glucose, Bld: 94 mg/dL (ref 70–99)
Glucose, Bld: 80 mg/dL (ref 70–99)
Potassium: 3.9 mmol/L (ref 3.5–5.1)
Potassium: 5.1 mmol/L (ref 3.5–5.1)
SODIUM: 139 mmol/L (ref 135–145)
Sodium: 141 mmol/L (ref 135–145)

## 2015-01-31 LAB — PROCALCITONIN: Procalcitonin: 2.45 ng/mL

## 2015-01-31 LAB — BLOOD GAS, ARTERIAL
Acid-base deficit: 7.9 mmol/L — ABNORMAL HIGH (ref 0.0–2.0)
BICARBONATE: 17.3 meq/L — AB (ref 20.0–24.0)
Drawn by: 252031
FIO2: 0.4 %
LHR: 20 {breaths}/min
O2 Saturation: 91 %
PATIENT TEMPERATURE: 97.6
PEEP: 5 cmH2O
PO2 ART: 67.9 mmHg — AB (ref 80.0–100.0)
TCO2: 18.4 mmol/L (ref 0–100)
VT: 550 mL
pCO2 arterial: 34.9 mmHg — ABNORMAL LOW (ref 35.0–45.0)
pH, Arterial: 7.312 — ABNORMAL LOW (ref 7.350–7.450)

## 2015-01-31 LAB — LEGIONELLA ANTIGEN, URINE

## 2015-01-31 LAB — TROPONIN I
TROPONIN I: 43.72 ng/mL — AB (ref <0.031)
TROPONIN I: 60.44 ng/mL — AB (ref <0.031)
Troponin I: >80 ng/mL (ref <0.031)

## 2015-01-31 LAB — PHOSPHORUS: PHOSPHORUS: 6.3 mg/dL — AB (ref 2.3–4.6)

## 2015-01-31 LAB — PROTIME-INR
INR: 2.01 — AB (ref 0.00–1.49)
Prothrombin Time: 22.9 seconds — ABNORMAL HIGH (ref 11.6–15.2)

## 2015-01-31 LAB — LACTIC ACID, PLASMA: Lactic Acid, Venous: 9.7 mmol/L (ref 0.5–2.0)

## 2015-01-31 LAB — MAGNESIUM: Magnesium: 2.1 mg/dL (ref 1.5–2.5)

## 2015-01-31 MED ORDER — DEXTROSE 50 % IV SOLN
INTRAVENOUS | Status: AC
  Administered 2015-01-31: 50 mL
  Filled 2015-01-31: qty 50

## 2015-01-31 MED ORDER — MIDAZOLAM HCL 5 MG/ML IJ SOLN
10.0000 mg/h | INTRAMUSCULAR | Status: DC
  Administered 2015-01-31: 10 mg/h via INTRAVENOUS
  Filled 2015-01-31: qty 10

## 2015-01-31 MED ORDER — SODIUM BICARBONATE 8.4 % IV SOLN
INTRAVENOUS | Status: DC
  Filled 2015-01-31: qty 150

## 2015-01-31 MED ORDER — DEXTROSE 50 % IV SOLN: 50.0000 mL | Freq: Once | INTRAVENOUS | Status: DC

## 2015-01-31 MED ORDER — MIDAZOLAM BOLUS VIA INFUSION
5.0000 mg | INTRAVENOUS | Status: DC | PRN
  Filled 2015-01-31: qty 20

## 2015-01-31 MED ORDER — IMIPENEM-CILASTATIN 250 MG IV SOLR
250.0000 mg | Freq: Two times a day (BID) | INTRAVENOUS | Status: DC
  Filled 2015-01-31: qty 250

## 2015-01-31 MED ORDER — FENTANYL BOLUS VIA INFUSION
50.0000 ug | INTRAVENOUS | Status: DC | PRN
  Filled 2015-01-31: qty 200

## 2015-01-31 MED ORDER — SODIUM CHLORIDE 0.9 % IV SOLN
100.0000 ug/h | INTRAVENOUS | Status: DC
  Administered 2015-01-31: 150 ug/h via INTRAVENOUS
  Filled 2015-01-31: qty 50

## 2015-01-31 MED ORDER — DEXTROSE 50 % IV SOLN
25.0000 mL | Freq: Once | INTRAVENOUS | Status: AC
  Administered 2015-01-31: 25 mL via INTRAVENOUS

## 2015-01-31 MED ORDER — DEXTROSE 50 % IV SOLN
INTRAVENOUS | Status: AC
  Filled 2015-01-31: qty 50

## 2015-01-31 MED ORDER — DEXTROSE 50 % IV SOLN
INTRAVENOUS | Status: AC
  Administered 2015-01-31: 25 mL via INTRAVENOUS
  Filled 2015-01-31: qty 50

## 2015-02-01 LAB — HEMOGLOBIN A1C
HEMOGLOBIN A1C: 6.6 % — AB (ref 4.8–5.6)
Mean Plasma Glucose: 143 mg/dL

## 2015-02-01 LAB — CULTURE, RESPIRATORY

## 2015-02-01 LAB — CULTURE, RESPIRATORY W GRAM STAIN: Gram Stain: NONE SEEN

## 2015-02-04 ENCOUNTER — Telehealth: Payer: Self-pay

## 2015-02-04 NOTE — Telephone Encounter (Signed)
Received death certificate from [facility] 02/04/15.  Will send to Dr. Chase Caller tomorrow for signature.  Received back 02/06/15 and mailed to [facility].

## 2015-02-04 NOTE — Plan of Care (Signed)
Problem: Phase III Progression Outcomes Goal: Mental status at or near baseline Outcome: Not Met (add Reason) Anoxic brain injury per medical notes

## 2015-02-08 NOTE — Progress Notes (Signed)
PULMONARY / CRITICAL CARE MEDICINE   Name: Jeremy Mcdaniel MRN: [id number] DOB: 10/19/1923    ADMISSION DATE:  01/20/2015 CONSULTATION DATE:  02/07/2015  REFERRING MD :  EDP  CHIEF COMPLAINT:  Cardiac arrest  INITIAL PRESENTATION:    81 year old male with PMH as below, which is significant for HTN, HLD, CLL, DM and CHF. He was outside at his house gardening when he suffered a witnessed cardiac arrest. Wife called EMS, 4-5 mins without CPR until their arrival. He was found to be in VF arrest. Underwent 20 mins ACLS epi x 4 and defibrillation. Total downtime estimated 25 mins. In ED he was found to have inferior ST elevation on EKG and was taken to cath lab. He had 100% LAD occlusion requiring PCI. During this he converted to VF twice, converting back to sinus with a single defibrillation each time. Post-procedurally he was taken to ICU on ventilator. PCCM primary.   STUDIES: and EVENTs CT head 3/22 > 3/22 Cardiac arrest > to cath lab 100% LAD occlusion > PCI > to ICU on vent  01/30/15: On inducted hypothermia protocol. Worsening lactic acidosis and multiple pressors. - vasopressin, levophed, neo and bicarb gtt added overnight. This AM off neo. Leviphed at 88mcg. On amio gtt.  Echo ordered. SCVO2 88%. EEG - EEG because of the presence of very prolonged periods of background suppression with intermixed bursts of low amplitude theta-delta activity. Tracheal aspirate sent   SUBJECTIVE/OVERNIGHT/INTERVAL HX 02-03-15: EEG with burst suppression. Rewarming ongoing +. On fent/versed gtt. On vasopressors - vaso, levophed 50 but off neo. On amio gtt as well. Near anuric/creat up. Trop 40 with worsening lactic acidosis 9. Flu nasal swab negative.    VITAL SIGNS: Temp:  [90.7 F (32.6 C)-98.4 F (36.9 C)] 94.5 F (34.7 C) (03/24 0800) Pulse Rate:  [52-77] 56 (03/24 0808) Resp:  [0-20] 20 (03/24 0808) BP: (128)/(61) 128/61 mmHg (03/24 0808) SpO2:  [86 %-98 %] 86 % (03/24 0800) Arterial Line BP:  (90-130)/(47-70) 129/62 mmHg (03/24 0800) FiO2 (%):  [40 %-60 %] 60 % (03/24 0808) HEMODYNAMICS: CVP:  [9 mmHg-13 mmHg] 11 mmHg VENTILATOR SETTINGS: Vent Mode:  [-] PRVC FiO2 (%):  [40 %-60 %] 60 % Set Rate:  [20 bmp] 20 bmp Vt Set:  [550 mL] 550 mL PEEP:  [5 cmH20] 5 cmH20 Plateau Pressure:  [22 cmH20-26 cmH20] 25 cmH20 INTAKE / OUTPUT:  Intake/Output Summary (Last 24 hours) at 02-03-15 1004 Last data filed at 2015-02-03 0600  Gross per 24 hour  Intake 4341.87 ml  Output    370 ml  Net 3971.87 ml    PHYSICAL EXAMINATION: General: Elderly male,Critically ill llooking Neuro: Sedated and paralyzed, Being rewarmed HEENT: [place]/AT. PERRL, sclerae anicteric. Cardiovascular: RRR, no M/R/G.  Lungs: Respirations even - sync with vent Abdomen: BS x 4, soft, NT/ND.  Musculoskeletal: No gross deformities, no edema.  Skin: Intact, warm, no rashes.   LABS: PULMONARY  Recent Labs Lab 01/30/15 0239 01/30/15 0334 01/30/15 0443 01/30/15 0545 01/30/15 1100 01/30/15 1110 February 03, 2015 0306  PHART 7.186* 7.424 7.318*  --   --  7.369 7.312*  PCO2ART 37.8 21.8* 30.0*  --   --  27.5* 34.9*  PO2ART 58.0* 56.0* 64.0*  --   --  52.4* 67.9*  HCO3 15.2* 14.8* 16.1*  --   --  16.5* 17.3*  TCO2 17 16 17   --   --  17.5 18.4  O2SAT 90.0 94.0 94.0 81.7 69.8 91.2 91.0    CBC  Recent  Labs Lab 01/30/2015 2030 01/30/15 0315 02/25/2015 0405  HGB 12.4* 11.3* 11.1*  HCT 38.8* 35.5* 34.6*  WBC 17.2* 11.5* 10.5  PLT 213 181 126*    COAGULATION  Recent Labs Lab 01/16/2015 2030 01/28/2015 2236 01/30/15 0315 01/30/15 1024 02-25-15 0405  INR 8.94* 3.26* 1.93* 1.46 2.01*    CARDIAC    Recent Labs Lab 01/30/15 0829 01/30/15 1024 01/30/15 1705 01/30/15 2300 Feb 25, 2015 0405  TROPONINI 31.83* 29.85* 28.68* 43.72* 60.44*   No results for input(s): PROBNP in the last 168 hours.   CHEMISTRY  Recent Labs Lab 01/30/15 0008  01/30/15 0834 01/30/15 1024 01/30/15 1705 01/30/15 2300  Feb 25, 2015 0405  NA  --   < > 141 143 141 141 139  K  --   < > 3.2* 3.3* 3.1* 3.9 5.1  CL  --   < > 107 110 104 107 103  CO2  --   < > 18* 20 18* 18* 17*  GLUCOSE  --   < > 208* 212* 141* 94 80  BUN  --   < > 26* 30* 30* 31* 35*  CREATININE  --   < > 1.93* 2.01* 2.11* 2.35* 2.65*  CALCIUM  --   < > 7.6* 7.2* 7.3* 7.2* 6.7*  MG 1.9  --   --   --   --   --  2.1  PHOS  --   --   --   --   --   --  6.3*  < > = values in this interval not displayed. Estimated Creatinine Clearance: 20.8 mL/min (by C-G formula based on Cr of 2.65).   LIVER  Recent Labs Lab 02/07/2015 1730 02/01/2015 2030 02/04/2015 2236 01/30/15 0315 01/30/15 1024 2015/02/25 0405  AST 189*  --   --  256*  --   --   ALT 112*  --   --  112*  --   --   ALKPHOS 119*  --   --  86  --   --   BILITOT 0.5  --   --  0.6  --   --   PROT 5.2*  --   --  4.2*  --   --   ALBUMIN 3.0*  --   --  2.7*  --   --   INR 1.30 8.94* 3.26* 1.93* 1.46 2.01*     INFECTIOUS  Recent Labs Lab 01/30/15 0323 01/30/15 0615 01/30/15 1500 02-25-15 0001 February 25, 2015 0405  LATICACIDVEN 8.5*  --  8.0* 9.7*  --   PROCALCITON  --  2.36  --   --  2.45     ENDOCRINE CBG (last 3)   Recent Labs  02-25-2015 2015/02/25 0404 25-Feb-2015 0752  GLUCAP 72 78 56*         IMAGING x48h Dg Chest Port 1 View  25-Feb-2015   CLINICAL DATA:  Hypoxia  EXAM: PORTABLE CHEST - 1 VIEW  COMPARISON:  January 29, 2015  FINDINGS: Endotracheal tube tip is 1.3 cm above the carina. Nasogastric tube tip and side port are below the diaphragm. Central catheter tip is in the left innominate vein near the junction with the superior vena cava. No pneumothorax. There is patchy bibasilar atelectasis. Lungs are otherwise clear. Heart is mildly enlarged with pulmonary vascularity within normal limits. No adenopathy. No bone lesions.  IMPRESSION: Tube and catheter positions as described without pneumothorax. Patchy bibasilar atelectasis. Stable cardiac prominence.   Electronically Signed    By: Lowella Grip III M.D.  On: 02-11-2015 07:20   Dg Chest Port 1 View  01/30/2015   CLINICAL DATA:  Central line placement.  Initial encounter.  EXAM: PORTABLE CHEST - 1 VIEW  COMPARISON:  Chest radiograph performed earlier today at 9:39 p.m.  FINDINGS: The patient's left IJ line is noted ending overlying the proximal to mid SVC.  The endotracheal tube is seen ending 2-3 cm above the carina. An enteric tube is noted extending below the diaphragm.  Increased interstitial markings are seen, more prominent on the right. This is mildly improved from the prior study, and may reflect pneumonia or asymmetric interstitial edema. No pleural effusion or pneumothorax is seen.  The cardiomediastinal silhouette is mildly enlarged. No acute osseous abnormalities are identified.  IMPRESSION: 1. Left IJ line noted ending overlying the proximal to mid SVC. 2. Endotracheal tube seen ending 2-3 cm above the carina. 3. Mild interval improvement in increased interstitial markings, more prominent on the right. This may reflect pneumonia or asymmetric interstitial edema. 4. Mild cardiomegaly noted.   Electronically Signed   By: Garald Balding M.D.   On: 01/30/2015 00:28   Dg Chest Port 1 View  02/06/2015   CLINICAL DATA:  Central line placement  EXAM: PORTABLE CHEST - 1 VIEW  COMPARISON:  06/04/2008  FINDINGS: Endotracheal tube terminates 3 cm above the carina.  Left IJ venous catheter terminates in the mid SVC.  Patchy right upper lobe opacity. In the appropriate clinical setting, this is suspicious for pneumonia.  Possible superimposed mild interstitial edema. No pleural effusion or pneumothorax.  Cardiomegaly.  IMPRESSION: Endotracheal tube terminates 3 cm above the carina.  Left IJ venous catheter terminates in the mid SVC.  No pneumothorax.  Patchy right upper lobe opacity. In the appropriate clinical setting, suspicious for pneumonia.  Cardiomegaly.  Possible superimposed mild interstitial edema.   Electronically Signed    By: Julian Hy M.D.   On: 01/20/2015 21:59   Dg Abd Portable 1v  01/11/2015   CLINICAL DATA:  NG tube insertion  EXAM: PORTABLE ABDOMEN - 1 VIEW  COMPARISON:  None.  FINDINGS: Enteric tube is looped in the stomach and terminates in the gastric antrum.  Operative bowel gas pattern.  Moderate right colonic stool burden.  IMPRESSION: Enteric tube is looped in the stomach and terminates in the gastric antrum.   Electronically Signed   By: Julian Hy M.D.   On: 02/05/2015 20:58        ASSESSMENT / PLAN:  PULMONARY OETT 3/22 > A: Acute hypoxic respiratory failure following cardiac arrest   - does not meet Sbt criteria  P:   Full vent support, 8cc/kg. VAP bundle. Hold daily SBT while undergoing hypothermia protocol.    CARDIOVASCULAR CVL L IJ 3/22 (pending) >>> A:  VF/VT arrest due to 100% LAD occlusion s/p PCI 3/22 Cardiogenic shock Hx HTN, HLD, grade 2 DD (EF 60%)   - ongoing severe circulatory shock; This is clearly cardiogenic shock (as opposed to septic)  P:  Dc empiric hydrocort after sending cortisol -f cortisol > 25 Continue amiodarone per cards. Levophed and Vasopressinas needed to maintain goal MAP >65. No fuirther escalation of pressors  RENAL A:   Acute Kidney Injury (any one) Increase in SCr by > 0.3 within 48 hours Increase SCr to > 1.5 times baseline Urine volume < 0.5 ml/kg/h for 6 hrs  Stage: Risk: 1.5x increase in creatinine or GFR decrease by 25% or UOP <0.71ml/kgperhr for 6 hrs Injury: 2x increase in creatinine or GFR decrease  by 50% or UOP < 0.45ml/kgperhr for 12 hr Failure:3X increase in creatinine or GFR decrease by 75% or UOP < 0.4ml/kgperhr for 12 hr or anuria 12 hrs - onset 2015-02-05 Loss: complete loss of kidney function for more then 4 weeks End-stage renal disease:Complete loss of kidney function for more then 3 months   P:    Not a candidate for CRRT  GASTROINTESTINAL A:   Transaminitis - likely from shock liver GI prophylaxis Nutrition   - likely mild shock liver  P:   Trend LFT's. SUP: Pantoprazole. NPO. Nutrition consult for TF's. - if family wishes to be aggressive  HEMATOLOGIC A:   Mild anemia VTE prophylaxis P:  Transfuse for Hgb < 8 in setting of MI SCD's / Heparin. Coags q8hrs. CBC in AM.  INFECTIOUS   A:  Possible Pneumonia Sepsis. MRSA PCR positive. Flu nasal swab neative  P:   Await urine leg and strep 3/23 Await Trach aspirate  Multiplex PCR for resp virus 3/23 Await trach aspirate for bacteria 3/23 Rx Vanc and Imipnem empiric Monitor clinically.  ENDOCRINE A:   DM   P:   ICU hyperglycemia protocol.  NEUROLOGIC A:   Acute metabolic encephalopathy Hx TIA  - displayuing myoclonus on wua. EEG 01/30/15  Abnormal. Being rewarmed 2015/02/05   P:   Sedation:  Fentanyl gtt / Midazolam gtt. Neur consult 02/01/15 depending on family goals of care and patient neuro status RASS goal:2    FAMILY  - Updates: children at bedside udpated February 05, 2015   - Inter-disciplinary family meet or Palliative Care meeting due by:  3/28 but done 02-05-2015 with wife, sons x 2, girlfriend of a son, and brother in [employer]. They clearly state that functional living is main goal of patient. Explained MODS and unsurvivable condition. For now DNAR, no escalation in pressors, No CRRT but continue full medical care. Discussed terminal wean - they are deliberating. They might want to see how the nex 1-2 days progress before deciding on terminal wean.     The patient is critically ill with multiple organ systems failure and requires high complexity decision making for assessment and support, frequent evaluation and titration of therapies, application of advanced monitoring technologies and extensive interpretation of multiple databases.   Critical Care Time devoted to patient care services described in this  note is  40  Minutes. This time reflects time of care of this signee Dr Brand Males. This critical care time does not reflect procedure time, or teaching time or supervisory time of PA/NP/Med student/Med Resident etc but could involve care discussion time    Dr. Brand Males, M.D., [facility].C.P Pulmonary and Critical Care Medicine Staff Physician [place] [facility] Pager: [phone], If no answer or between  15:00h - 7:00h: call [phone]  02-05-15 10:04 AM

## 2015-02-08 NOTE — Progress Notes (Signed)
CRITICAL VALUE ALERT  Critical value received:  Lactic acid 9.7  Date of notification:  02/24/2015  Time of notification:  1:10 AM  Critical value read back:Yes.    Nurse who received alert:  Aceson Labell, Meda Klinefelter  MD notified (1st page):  Dr Elsworth Soho  Time of first page:  0110  MD made aware, no new orders

## 2015-02-08 NOTE — Progress Notes (Signed)
[place] Progress Note Patient Name: Jeremy Mcdaniel DOB: 1923-10-10 MRN: [id number]   Date of Service  Feb 24, 2015  HPI/Events of Note  Recurrent hypoglycemia  eICU Interventions  SSI DC'd Cont to monitor CBGs Dextrose added to IVFs     Intervention Category Intermediate Interventions: Other:  Merton Border 02/24/15, 9:15 PM

## 2015-02-08 NOTE — Procedures (Addendum)
Extubation Procedure Note  Patient Details:   Name: Jeremy Mcdaniel DOB: [birth year] MRN: [id number]   Airway Documentation:     Evaluation  O2 sats: currently acceptable Complications: No apparent complications Patient did tolerate procedure well. Bilateral Breath Sounds: Clear Suctioning: Oral, Airway No     RT switched patient to CPAP 5/5; patient had exhaled VT 255-300 ml.  Per MD order withdraw care of life sustaining treatment. RT extubated patient with no apparent complications.  Family at bedside.  Carrington Clamp A Feb 10, 2015, 11:40 PM

## 2015-02-08 NOTE — Progress Notes (Signed)
Chaplain paged to visit with Jeremy Shark family as pt is transitioning to comfort care.   Chaplain met family in waiting area. Pt wife of 74 years, two adult sons (only children) and their significant others present.   Family requested prayer. Pt wife expressed that she "has peace in her heart" as the family shared beautiful stories and heartfelt moments they gained from his life.   Chaplain offered prayer, empathic listening, facilitated story-telling and provided emotional/spiritual support.   Family currently bedside supporting one another and speaking with nurse.   Jeremy Mcdaniel, Jeremy Mcdaniel 02-07-2015 10:56 PM

## 2015-02-08 NOTE — Progress Notes (Signed)
eLink Physician-Brief Progress Note Patient Name: Jeremy Mcdaniel DOB: 1923-04-02 MRN: [id number]   Date of Service  Feb 14, 2015  HPI/Events of Note  Family approached RN and requested withdrawal of vent and comfort care  eICU Interventions  Discussed with pt's son Reviewed medical records Withdrawal protocol ordered     Intervention Category Major Interventions: End of life / care limitation discussion  Merton Border 02-14-15, 9:51 PM

## 2015-02-08 NOTE — Progress Notes (Signed)
DAILY PROGRESS NOTE  Subjective:  Remains intubated on multiple pressors. Rising lactic acid level. Troponin rising. Renal function is declining. 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  Objective:  Temp:  [89.1 F (31.7 C)-98.4 F (36.9 C)] 94.5 F (34.7 C) (03/24 0800) Pulse Rate:  [52-77] 56 (03/24 0808) Resp:  [0-20] 20 (03/24 0808) BP: (128)/(61) 128/61 mmHg (03/24 0808) SpO2:  [86 %-98 %] 86 % (03/24 0800) Arterial Line BP: (90-130)/(47-70) 129/62 mmHg (03/24 0800) FiO2 (%):  [40 %-60 %] 60 % (03/24 0808) Weight change:   Intake/Output from previous day: 03/23 0701 - 03/24 0700 In: 5345.3 [I.V.:4615.3; NG/GT:30; IV Piggyback:700] Out: 455 [Urine:455]  Intake/Output from this shift:    Medications: Current Facility-Administered Medications  Medication Dose Route Frequency Provider Last Rate Last Dose  . 0.9 %  sodium chloride infusion  250 mL Intravenous PRN Leonie Man, MD 10 mL/hr at 01/30/15 1900 250 mL at 01/30/15 1900  . acetaminophen (TYLENOL) tablet 650 mg  650 mg Oral Q4H PRN Leonie Man, MD      . amiodarone (NEXTERONE PREMIX) 360 MG/200ML (1.8 mg/mL) IV infusion  30 mg/hr Intravenous Continuous Leonie Man, MD 16.7 mL/hr at 02/26/2015 0825 30 mg/hr at 02-26-15 0825  . antiseptic oral rinse (BIOTENE) solution 15 mL  15 mL Mouth Rinse QID Leonie Man, MD   15 mL at 02-26-2015 0400  . aspirin chewable tablet 81 mg  81 mg Per Tube Daily Pixie Casino, MD      . chlorhexidine (PERIDEX) 0.12 % solution 15 mL  15 mL Mouth/Throat BID Leonie Man, MD   15 mL at 01/30/15 2153  . Chlorhexidine Gluconate Cloth 2 % PADS 6 each  6 each Topical Q0600 Leonie Man, MD   6 each at 02-26-2015 0600  . dextrose 50 % solution           . docusate (COLACE) 50 MG/5ML liquid 100 mg  100 mg Per Tube BID PRN Brand Males, MD       . fentaNYL (SUBLIMAZE) 2,500 mcg in sodium chloride 0.9 % 250 mL (10 mcg/mL) infusion  25-400 mcg/hr Intravenous Continuous Raylene Miyamoto, MD 15 mL/hr at 26-Feb-2015 0855 150 mcg/hr at 02/26/2015 0855  . fentaNYL (SUBLIMAZE) injection 100 mcg  100 mcg Intravenous Q15 min PRN Brand Males, MD      . fentaNYL (SUBLIMAZE) injection 100 mcg  100 mcg Intravenous Q2H PRN Brand Males, MD      . hydrocortisone sodium succinate (SOLU-CORTEF) 100 MG injection 50 mg  50 mg Intravenous 4 times per day Brand Males, MD   50 mg at 26-Feb-2015 0651  . imipenem-cilastatin (PRIMAXIN) 500 mg in sodium chloride 0.9 % 100 mL IVPB  500 mg Intravenous Q6H Leonie Man, MD   500 mg at 02/26/15 0827  . insulin aspart (novoLOG) injection 2-6 Units  2-6 Units Subcutaneous 6 times per day Raylene Miyamoto, MD   2 Units at 01/30/15 2121  . insulin glargine (LANTUS) injection 30 Units  30 Units Subcutaneous Q24H Raylene Miyamoto, MD   30 Units at 01/30/15 2153  . midazolam (VERSED) 50 mg in sodium chloride 0.9 % 50 mL (1 mg/mL) infusion  1-10 mg/hr Intravenous Continuous Brand Males, MD 6 mL/hr at 02/26/2015 0800 6 mg/hr at 02-26-2015 0800  . midazolam (VERSED) injection 1 mg  1 mg Intravenous Q15 min PRN Brand Males, MD      . midazolam (VERSED) injection 1 mg  1 mg Intravenous Q2H PRN Brand Males, MD      . mupirocin ointment (BACTROBAN) 2 % 1 application  1 application Nasal BID Leonie Man, MD   1 application at 72/62/03 2154  . norepinephrine (LEVOPHED) 16 mg in dextrose 5 % 250 mL (0.064 mg/mL) infusion  0-50 mcg/min Intravenous Titrated Leonie Man, MD 46.9 mL/hr at 2015-02-17 0800 50 mcg/min at 02-17-2015 0800  . ondansetron (ZOFRAN) injection 4 mg  4 mg Intravenous Q6H PRN Leonie Man, MD      . pantoprazole sodium (PROTONIX) 40 mg/20 mL oral suspension 40 mg  40 mg Per Tube Daily Lyndee Leo, RPH   40 mg at 01/30/15 1719  . phenylephrine (NEO-SYNEPHRINE) 10 mg in dextrose 5 %  250 mL (0.04 mg/mL) infusion  30-200 mcg/min Intravenous Continuous Colbert Coyer, MD   Stopped at 2015-02-17 0600  . sodium bicarbonate 150 mEq in sterile water 1,000 mL infusion   Intravenous Continuous Rahul P Desai, PA-C 75 mL/hr at February 17, 2015 0825    . sodium chloride 0.9 % injection 10-40 mL  10-40 mL Intracatheter Q12H Leonie Man, MD   10 mL at 01/30/15 0919  . sodium chloride 0.9 % injection 10-40 mL  10-40 mL Intracatheter PRN Leonie Man, MD      . sodium chloride 0.9 % injection 3 mL  3 mL Intravenous Q12H Leonie Man, MD   3 mL at 01/30/15 0919  . sodium chloride 0.9 % injection 3 mL  3 mL Intravenous PRN Leonie Man, MD      . ticagrelor [facility]) tablet 90 mg  90 mg Oral BID Leonie Man, MD   90 mg at 01/30/15 2153  . [START ON 02/01/2015] vancomycin (VANCOCIN) IVPB 1000 mg/200 mL premix  1,000 mg Intravenous Q48H Leonie Man, MD      . vasopressin (PITRESSIN) 40 Units in sodium chloride 0.9 % 250 mL (0.16 Units/mL) infusion  0.03 Units/min Intravenous Continuous Colbert Coyer, MD 11.3 mL/hr at 01/30/15 2355 0.03 Units/min at 01/30/15 2355    Physical Exam: General appearance: intubated, sedated on vent, paralyzed, on arctic sun protocol Neck: JVD - 3 cm above sternal notch and no carotid bruit Lungs: diminished breath sounds bilaterally Heart: regular rate and rhythm Abdomen: soft, non-tender; bowel sounds normal; no masses,  no organomegaly Extremities: cool, trace edema Pulses: faint pulses Skin: warm, pale, dry Neurologic: Mental status: Sedated, intubated on vent Psych: Cannot assess  Lab Results: Results for orders placed or performed during the hospital encounter of 02/07/2015 (from the past 48 hour(s))  I-Stat Troponin, ED (not at [facility])     Status: Abnormal   Collection Time: 01/18/2015  5:11 PM  Result Value Ref Range   Troponin i, poc 0.42 (HH) 0.00 - 0.08 ng/mL   Comment NOTIFIED PHYSICIAN    Comment 3            Comment: Due to  the release kinetics of cTnI, a negative result within the first hours of the onset of symptoms does not rule out myocardial infarction with certainty. If myocardial infarction is still suspected, repeat the test at appropriate intervals.   I-Stat Chem 8, ED     Status: Abnormal   Collection Time: 02/06/2015  5:13 PM  Result Value Ref Range   Sodium 140 135 - 145 mmol/L  Potassium 3.9 3.5 - 5.1 mmol/L   Chloride 105 96 - 112 mmol/L   BUN 23 6 - 23 mg/dL   Creatinine, Ser 1.70 (H) 0.50 - 1.35 mg/dL   Glucose, Bld 326 (H) 70 - 99 mg/dL   Calcium, Ion 1.09 (L) 1.13 - 1.30 mmol/L   TCO2 17 0 - 100 mmol/L   Hemoglobin 12.9 (L) 13.0 - 17.0 g/dL   HCT 38.0 (L) 39.0 - 52.0 %  CBC     Status: Abnormal   Collection Time: 01/28/2015  5:30 PM  Result Value Ref Range   WBC 16.8 (H) 4.0 - 10.5 K/uL   RBC 4.38 4.22 - 5.81 MIL/uL   Hemoglobin 11.7 (L) 13.0 - 17.0 g/dL   HCT 37.7 (L) 39.0 - 52.0 %   MCV 86.1 78.0 - 100.0 fL   MCH 26.7 26.0 - 34.0 pg   MCHC 31.0 30.0 - 36.0 g/dL   RDW 14.9 11.5 - 15.5 %   Platelets 187 150 - 400 K/uL  CK total and CKMB (cardiac)     Status: Abnormal   Collection Time: 02/03/2015  5:30 PM  Result Value Ref Range   Total CK 207 7 - 232 U/L   CK, MB 17.0 (HH) 0.3 - 4.0 ng/mL    Comment: REPEATED TO VERIFY CRITICAL RESULT CALLED TO, READ BACK BY AND VERIFIED WITH: B ROBINSON,RCIS 1911 02/05/2015 D BRADLEY    Relative Index 8.2 (H) 0.0 - 2.5  Comprehensive metabolic panel     Status: Abnormal   Collection Time: 01/10/2015  5:30 PM  Result Value Ref Range   Sodium 140 135 - 145 mmol/L   Potassium 4.4 3.5 - 5.1 mmol/L   Chloride 108 96 - 112 mmol/L   CO2 20 19 - 32 mmol/L   Glucose, Bld 322 (H) 70 - 99 mg/dL   BUN 20 6 - 23 mg/dL   Creatinine, Ser 1.89 (H) 0.50 - 1.35 mg/dL   Calcium 7.9 (L) 8.4 - 10.5 mg/dL   Total Protein 5.2 (L) 6.0 - 8.3 g/dL   Albumin 3.0 (L) 3.5 - 5.2 g/dL   AST 189 (H) 0 - 37 U/L   ALT 112 (H) 0 - 53 U/L   Alkaline Phosphatase 119 (H) 39  - 117 U/L   Total Bilirubin 0.5 0.3 - 1.2 mg/dL   GFR calc non Af Amer 30 (L) >90 mL/min   GFR calc Af Amer 35 (L) >90 mL/min    Comment: (NOTE) The eGFR has been calculated using the CKD EPI equation. This calculation has not been validated in all clinical situations. eGFR's persistently <90 mL/min signify possible Chronic Kidney Disease.    Anion gap 12 5 - 15  Lipid panel     Status: Abnormal   Collection Time: 02/01/2015  5:30 PM  Result Value Ref Range   Cholesterol 124 0 - 200 mg/dL   Triglycerides 237 (H) <150 mg/dL   HDL 29 (L) >39 mg/dL   Total CHOL/HDL Ratio 4.3 RATIO   VLDL 47 (H) 0 - 40 mg/dL   LDL Cholesterol 48 0 - 99 mg/dL    Comment:        Total Cholesterol/HDL:CHD Risk Coronary Heart Disease Risk Table                     Men   Women  1/2 Average Risk   3.4   3.3  Average Risk       5.0   4.4  2 X Average Risk   9.6   7.1  3 X Average Risk  23.4   11.0        Use the calculated Patient Ratio above and the CHD Risk Table to determine the patient's CHD Risk.        ATP III CLASSIFICATION (LDL):  <100     mg/dL   Optimal  100-129  mg/dL   Near or Above                    Optimal  130-159  mg/dL   Borderline  160-189  mg/dL   High  >190     mg/dL   Very High   Protime-INR     Status: Abnormal   Collection Time: 01/28/2015  5:30 PM  Result Value Ref Range   Prothrombin Time 16.4 (H) 11.6 - 15.2 seconds   INR 1.30 0.00 - 1.49  APTT     Status: Abnormal   Collection Time: 01/14/2015  5:30 PM  Result Value Ref Range   aPTT 99 (H) 24 - 37 seconds    Comment:        IF BASELINE aPTT IS ELEVATED, SUGGEST PATIENT RISK ASSESSMENT BE USED TO DETERMINE APPROPRIATE ANTICOAGULANT THERAPY.   Troponin I     Status: Abnormal   Collection Time: 01/23/2015  5:30 PM  Result Value Ref Range   Troponin I 0.58 (HH) <0.031 ng/mL    Comment:        POSSIBLE MYOCARDIAL ISCHEMIA. SERIAL TESTING RECOMMENDED. REPEATED TO VERIFY CRITICAL RESULT CALLED TO, READ BACK BY AND  VERIFIED WITH: B ROBINSON,RCIS 1911 01/23/2015 D BRADLEY   I-STAT, chem 8     Status: Abnormal   Collection Time: 01/19/2015  5:37 PM  Result Value Ref Range   Sodium 139 135 - 145 mmol/L   Potassium 4.3 3.5 - 5.1 mmol/L   Chloride 107 96 - 112 mmol/L   BUN 23 6 - 23 mg/dL   Creatinine, Ser 1.50 (H) 0.50 - 1.35 mg/dL   Glucose, Bld 317 (H) 70 - 99 mg/dL   Calcium, Ion 1.11 (L) 1.13 - 1.30 mmol/L   TCO2 18 0 - 100 mmol/L   Hemoglobin 12.9 (L) 13.0 - 17.0 g/dL   HCT 38.0 (L) 39.0 - 52.0 %  I-STAT 3, arterial blood gas (G3+)     Status: Abnormal   Collection Time: 01/26/2015  5:55 PM  Result Value Ref Range   pH, Arterial 7.093 (LL) 7.350 - 7.450   pCO2 arterial 48.1 (H) 35.0 - 45.0 mmHg   pO2, Arterial 104.0 (H) 80.0 - 100.0 mmHg   Bicarbonate 14.7 (L) 20.0 - 24.0 mEq/L   TCO2 16 0 - 100 mmol/L   O2 Saturation 95.0 %   Acid-base deficit 15.0 (H) 0.0 - 2.0 mmol/L   Sample type ARTERIAL    Comment NOTIFIED PHYSICIAN   POCT Activated clotting time     Status: None   Collection Time: 01/21/2015  6:08 PM  Result Value Ref Range   Activated Clotting Time 589 seconds  I-STAT 3, arterial blood gas (G3+)     Status: Abnormal   Collection Time: 01/24/2015  6:17 PM  Result Value Ref Range   pH, Arterial 7.146 (LL) 7.350 - 7.450   pCO2 arterial 46.6 (H) 35.0 - 45.0 mmHg   pO2, Arterial 86.0 80.0 - 100.0 mmHg   Bicarbonate 16.1 (L) 20.0 - 24.0 mEq/L   TCO2 17 0 - 100 mmol/L   O2  Saturation 93.0 %   Acid-base deficit 13.0 (H) 0.0 - 2.0 mmol/L   Sample type ARTERIAL    Comment NOTIFIED PHYSICIAN   I-STAT 3, arterial blood gas (G3+)     Status: Abnormal   Collection Time: 01/08/2015  6:59 PM  Result Value Ref Range   pH, Arterial 7.182 (LL) 7.350 - 7.450   pCO2 arterial 48.2 (H) 35.0 - 45.0 mmHg   pO2, Arterial 73.0 (L) 80.0 - 100.0 mmHg   Bicarbonate 18.0 (L) 20.0 - 24.0 mEq/L   TCO2 19 0 - 100 mmol/L   O2 Saturation 90.0 %   Acid-base deficit 10.0 (H) 0.0 - 2.0 mmol/L   Sample type ARTERIAL     Comment NOTIFIED PHYSICIAN   Glucose, capillary     Status: Abnormal   Collection Time: 02/06/2015  8:12 PM  Result Value Ref Range   Glucose-Capillary 263 (H) 70 - 99 mg/dL   Comment 1 Capillary Specimen   CBC     Status: Abnormal   Collection Time: 01/12/2015  8:30 PM  Result Value Ref Range   WBC 17.2 (H) 4.0 - 10.5 K/uL   RBC 4.67 4.22 - 5.81 MIL/uL   Hemoglobin 12.4 (L) 13.0 - 17.0 g/dL   HCT 38.8 (L) 39.0 - 52.0 %   MCV 83.1 78.0 - 100.0 fL   MCH 26.6 26.0 - 34.0 pg   MCHC 32.0 30.0 - 36.0 g/dL   RDW 15.0 11.5 - 15.5 %   Platelets 213 150 - 400 K/uL  Creatinine, serum     Status: Abnormal   Collection Time: 01/12/2015  8:30 PM  Result Value Ref Range   Creatinine, Ser 1.83 (H) 0.50 - 1.35 mg/dL   GFR calc non Af Amer 31 (L) >90 mL/min   GFR calc Af Amer 36 (L) >90 mL/min    Comment: (NOTE) The eGFR has been calculated using the CKD EPI equation. This calculation has not been validated in all clinical situations. eGFR's persistently <90 mL/min signify possible Chronic Kidney Disease.   Troponin I (serum)     Status: Abnormal   Collection Time: 01/20/2015  8:30 PM  Result Value Ref Range   Troponin I 10.34 (HH) <0.031 ng/mL    Comment:        POSSIBLE MYOCARDIAL ISCHEMIA. SERIAL TESTING RECOMMENDED. REPEATED TO VERIFY CRITICAL RESULT CALLED TO, READ BACK BY AND VERIFIED WITH: J. PERRKINS RN  [phone] GREEN R   CK total and CKMB     Status: Abnormal   Collection Time: 01/23/2015  8:30 PM  Result Value Ref Range   Total CK 1155 (H) 7 - 232 U/L   CK, MB 194.4 (HH) 0.3 - 4.0 ng/mL    Comment: REPEATED TO VERIFY CRITICAL RESULT CALLED TO, READ BACK BY AND VERIFIED WITH: J. PERRKINS RN [phone] GREEN R    Relative Index 16.8 (H) 0.0 - 2.5  Troponin I     Status: Abnormal   Collection Time: 01/14/2015  8:30 PM  Result Value Ref Range   Troponin I 9.72 (HH) <0.031 ng/mL    Comment:        POSSIBLE MYOCARDIAL ISCHEMIA. SERIAL TESTING RECOMMENDED. REPEATED TO  VERIFY CRITICAL RESULT CALLED TO, READ BACK BY AND VERIFIED WITH: J. PERRKINS RN [phone] GREEN R   Basic metabolic panel     Status: Abnormal   Collection Time: 01/14/2015  8:30 PM  Result Value Ref Range   Sodium 138 135 - 145 mmol/L   Potassium 5.1  3.5 - 5.1 mmol/L   Chloride 104 96 - 112 mmol/L   CO2 18 (L) 19 - 32 mmol/L   Glucose, Bld 370 (H) 70 - 99 mg/dL   BUN 26 (H) 6 - 23 mg/dL   Creatinine, Ser 1.90 (H) 0.50 - 1.35 mg/dL   Calcium 7.6 (L) 8.4 - 10.5 mg/dL   GFR calc non Af Amer 30 (L) >90 mL/min   GFR calc Af Amer 34 (L) >90 mL/min    Comment: (NOTE) The eGFR has been calculated using the CKD EPI equation. This calculation has not been validated in all clinical situations. eGFR's persistently <90 mL/min signify possible Chronic Kidney Disease.    Anion gap 16 (H) 5 - 15  Protime-INR now and repeat in 8 hours     Status: Abnormal   Collection Time: 01/15/2015  8:30 PM  Result Value Ref Range   Prothrombin Time 73.6 (H) 11.6 - 15.2 seconds    Comment: REPEATED TO VERIFY   INR 8.94 (HH) 0.00 - 1.49    Comment: REPEATED TO VERIFY CRITICAL RESULT CALLED TO, READ BACK BY AND VERIFIED WITH: B.PERKINS,RN 2209 01/23/2015 M.CAMPBELL   APTT now and repeat in 8 hours     Status: Abnormal   Collection Time: 01/19/2015  8:30 PM  Result Value Ref Range   aPTT >200 (HH) 24 - 37 seconds    Comment:        IF BASELINE aPTT IS ELEVATED, SUGGEST PATIENT RISK ASSESSMENT BE USED TO DETERMINE APPROPRIATE ANTICOAGULANT THERAPY. REPEATED TO VERIFY CRITICAL RESULT CALLED TO, READ BACK BY AND VERIFIED WITH: B.PERKINS,RN 2209 01/25/2015 M.CAMPBELL   I-STAT 3, arterial blood gas (G3+)     Status: Abnormal   Collection Time: 01/23/2015  8:52 PM  Result Value Ref Range   pH, Arterial 7.288 (L) 7.350 - 7.450   pCO2 arterial 34.7 (L) 35.0 - 45.0 mmHg   pO2, Arterial 68.0 (L) 80.0 - 100.0 mmHg   Bicarbonate 17.2 (L) 20.0 - 24.0 mEq/L   TCO2 18 0 - 100 mmol/L   O2 Saturation 94.0 %   Acid-base  deficit 9.0 (H) 0.0 - 2.0 mmol/L   Patient temperature 34.3 C    Sample type ARTERIAL   MRSA PCR Screening     Status: Abnormal   Collection Time: 01/26/2015  8:55 PM  Result Value Ref Range   MRSA by PCR POSITIVE (A) NEGATIVE    Comment:        The GeneXpert MRSA Assay (FDA approved for NASAL specimens only), is one component of a comprehensive MRSA colonization surveillance program. It is not intended to diagnose MRSA infection nor to guide or monitor treatment for MRSA infections. RESULT CALLED TO, READ BACK BY AND VERIFIED WITH: CALLED TO RN [place] PERKINS [phone] @0041  THANEY   Glucose, capillary     Status: Abnormal   Collection Time: 02/07/2015  9:54 PM  Result Value Ref Range   Glucose-Capillary 354 (H) 70 - 99 mg/dL  Basic metabolic panel     Status: Abnormal   Collection Time: 01/16/2015 10:28 PM  Result Value Ref Range   Sodium 138 135 - 145 mmol/L   Potassium 5.0 3.5 - 5.1 mmol/L   Chloride 104 96 - 112 mmol/L   CO2 19 19 - 32 mmol/L   Glucose, Bld 381 (H) 70 - 99 mg/dL   BUN 27 (H) 6 - 23 mg/dL   Creatinine, Ser 1.97 (H) 0.50 - 1.35 mg/dL   Calcium 7.6 (L) 8.4 - 10.5  mg/dL   GFR calc non Af Amer 28 (L) >90 mL/min   GFR calc Af Amer 33 (L) >90 mL/min    Comment: (NOTE) The eGFR has been calculated using the CKD EPI equation. This calculation has not been validated in all clinical situations. eGFR's persistently <90 mL/min signify possible Chronic Kidney Disease.    Anion gap 15 5 - 15  Protime-INR     Status: Abnormal   Collection Time: 01/27/2015 10:36 PM  Result Value Ref Range   Prothrombin Time 33.5 (H) 11.6 - 15.2 seconds   INR 3.26 (H) 0.00 - 1.49  Glucose, capillary     Status: Abnormal   Collection Time: 02/03/2015 10:41 PM  Result Value Ref Range   Glucose-Capillary 286 (H) 70 - 99 mg/dL   Comment 1 Arterial Specimen   I-STAT 3, arterial blood gas (G3+)     Status: Abnormal   Collection Time: 01/16/2015 10:45 PM  Result Value Ref Range   pH, Arterial  7.329 (L) 7.350 - 7.450   pCO2 arterial 29.7 (L) 35.0 - 45.0 mmHg   pO2, Arterial 99.0 80.0 - 100.0 mmHg   Bicarbonate 16.2 (L) 20.0 - 24.0 mEq/L   TCO2 17 0 - 100 mmol/L   O2 Saturation 98.0 %   Acid-base deficit 9.0 (H) 0.0 - 2.0 mmol/L   Patient temperature 33.8 C    Sample type ARTERIAL   Lactic acid, plasma     Status: Abnormal   Collection Time: 01/28/2015 11:00 PM  Result Value Ref Range   Lactic Acid, Venous 5.3 (HH) 0.5 - 2.0 mmol/L    Comment: REPEATED TO VERIFY CRITICAL RESULT CALLED TO, READ BACK BY AND VERIFIED WITH: J. PERRKINS RN [phone] GREEN R   Glucose, capillary     Status: Abnormal   Collection Time: 01/15/2015 11:51 PM  Result Value Ref Range   Glucose-Capillary 392 (H) 70 - 99 mg/dL   Comment 1 Arterial Specimen   Basic metabolic panel     Status: Abnormal   Collection Time: 01/10/2015 11:59 PM  Result Value Ref Range   Sodium 136 135 - 145 mmol/L   Potassium 4.4 3.5 - 5.1 mmol/L   Chloride 105 96 - 112 mmol/L   CO2 18 (L) 19 - 32 mmol/L   Glucose, Bld 411 (H) 70 - 99 mg/dL   BUN 27 (H) 6 - 23 mg/dL   Creatinine, Ser 1.99 (H) 0.50 - 1.35 mg/dL   Calcium 7.3 (L) 8.4 - 10.5 mg/dL   GFR calc non Af Amer 28 (L) >90 mL/min   GFR calc Af Amer 33 (L) >90 mL/min    Comment: (NOTE) The eGFR has been calculated using the CKD EPI equation. This calculation has not been validated in all clinical situations. eGFR's persistently <90 mL/min signify possible Chronic Kidney Disease.    Anion gap 13 5 - 15  Magnesium     Status: None   Collection Time: 01/30/15 12:08 AM  Result Value Ref Range   Magnesium 1.9 1.5 - 2.5 mg/dL  Glucose, capillary     Status: Abnormal   Collection Time: 01/30/15 12:59 AM  Result Value Ref Range   Glucose-Capillary 369 (H) 70 - 99 mg/dL   Comment 1 Arterial Specimen   Glucose, capillary     Status: Abnormal   Collection Time: 01/30/15  1:54 AM  Result Value Ref Range   Glucose-Capillary 320 (H) 70 - 99 mg/dL   Comment 1 Arterial  Specimen   I-STAT 3, arterial  blood gas (G3+)     Status: Abnormal   Collection Time: 01/30/15  2:39 AM  Result Value Ref Range   pH, Arterial 7.186 (LL) 7.350 - 7.450   pCO2 arterial 37.8 35.0 - 45.0 mmHg   pO2, Arterial 58.0 (L) 80.0 - 100.0 mmHg   Bicarbonate 15.2 (L) 20.0 - 24.0 mEq/L   TCO2 17 0 - 100 mmol/L   O2 Saturation 90.0 %   Acid-base deficit 14.0 (H) 0.0 - 2.0 mmol/L   Patient temperature 32.6 C    Sample type ARTERIAL   Glucose, capillary     Status: Abnormal   Collection Time: 01/30/15  3:04 AM  Result Value Ref Range   Glucose-Capillary 315 (H) 70 - 99 mg/dL   Comment 1 Arterial Specimen   CBC     Status: Abnormal   Collection Time: 01/30/15  3:15 AM  Result Value Ref Range   WBC 11.5 (H) 4.0 - 10.5 K/uL   RBC 4.32 4.22 - 5.81 MIL/uL   Hemoglobin 11.3 (L) 13.0 - 17.0 g/dL   HCT 35.5 (L) 39.0 - 52.0 %   MCV 82.2 78.0 - 100.0 fL   MCH 26.2 26.0 - 34.0 pg   MCHC 31.8 30.0 - 36.0 g/dL   RDW 14.9 11.5 - 15.5 %   Platelets 181 150 - 400 K/uL  Troponin I (serum)     Status: Abnormal   Collection Time: 01/30/15  3:15 AM  Result Value Ref Range   Troponin I 32.43 (HH) <0.031 ng/mL    Comment:        POSSIBLE MYOCARDIAL ISCHEMIA. SERIAL TESTING RECOMMENDED. REPEATED TO VERIFY CRITICAL VALUE NOTED.  VALUE IS CONSISTENT WITH PREVIOUSLY REPORTED AND CALLED VALUE.   Basic metabolic panel     Status: Abnormal   Collection Time: 01/30/15  3:15 AM  Result Value Ref Range   Sodium 141 135 - 145 mmol/L   Potassium 3.4 (L) 3.5 - 5.1 mmol/L    Comment: DELTA CHECK NOTED   Chloride 104 96 - 112 mmol/L   CO2 19 19 - 32 mmol/L   Glucose, Bld 308 (H) 70 - 99 mg/dL   BUN 27 (H) 6 - 23 mg/dL   Creatinine, Ser 2.06 (H) 0.50 - 1.35 mg/dL   Calcium 7.3 (L) 8.4 - 10.5 mg/dL   GFR calc non Af Amer 27 (L) >90 mL/min   GFR calc Af Amer 31 (L) >90 mL/min    Comment: (NOTE) The eGFR has been calculated using the CKD EPI equation. This calculation has not been validated in all  clinical situations. eGFR's persistently <90 mL/min signify possible Chronic Kidney Disease.    Anion gap 18 (H) 5 - 15  Protime-INR now and repeat in 8 hours     Status: Abnormal   Collection Time: 01/30/15  3:15 AM  Result Value Ref Range   Prothrombin Time 22.2 (H) 11.6 - 15.2 seconds   INR 1.93 (H) 0.00 - 1.49  APTT now and repeat in 8 hours     Status: Abnormal   Collection Time: 01/30/15  3:15 AM  Result Value Ref Range   aPTT 55 (H) 24 - 37 seconds    Comment:        IF BASELINE aPTT IS ELEVATED, SUGGEST PATIENT RISK ASSESSMENT BE USED TO DETERMINE APPROPRIATE ANTICOAGULANT THERAPY.   Hepatic function panel     Status: Abnormal   Collection Time: 01/30/15  3:15 AM  Result Value Ref Range   Total Protein 4.2 (L)  6.0 - 8.3 g/dL   Albumin 2.7 (L) 3.5 - 5.2 g/dL   AST 256 (H) 0 - 37 U/L   ALT 112 (H) 0 - 53 U/L   Alkaline Phosphatase 86 39 - 117 U/L   Total Bilirubin 0.6 0.3 - 1.2 mg/dL   Bilirubin, Direct 0.1 0.0 - 0.5 mg/dL   Indirect Bilirubin 0.5 0.3 - 0.9 mg/dL  Lactic acid, plasma     Status: Abnormal   Collection Time: 01/30/15  3:23 AM  Result Value Ref Range   Lactic Acid, Venous 8.5 (HH) 0.5 - 2.0 mmol/L    Comment: REPEATED TO VERIFY CRITICAL RESULT CALLED TO, READ BACK BY AND VERIFIED WITH: J. PERRKINS RN [phone] GREEN R   I-STAT 3, arterial blood gas (G3+)     Status: Abnormal   Collection Time: 01/30/15  3:34 AM  Result Value Ref Range   pH, Arterial 7.424 7.350 - 7.450   pCO2 arterial 21.8 (L) 35.0 - 45.0 mmHg   pO2, Arterial 56.0 (L) 80.0 - 100.0 mmHg   Bicarbonate 14.8 (L) 20.0 - 24.0 mEq/L   TCO2 16 0 - 100 mmol/L   O2 Saturation 94.0 %   Acid-base deficit 9.0 (H) 0.0 - 2.0 mmol/L   Patient temperature 33.2 C    Sample type ARTERIAL   Glucose, capillary     Status: Abnormal   Collection Time: 01/30/15  4:02 AM  Result Value Ref Range   Glucose-Capillary 297 (H) 70 - 99 mg/dL   Comment 1 Capillary Specimen   I-STAT 3, arterial blood gas  (G3+)     Status: Abnormal   Collection Time: 01/30/15  4:43 AM  Result Value Ref Range   pH, Arterial 7.318 (L) 7.350 - 7.450   pCO2 arterial 30.0 (L) 35.0 - 45.0 mmHg   pO2, Arterial 64.0 (L) 80.0 - 100.0 mmHg   Bicarbonate 16.1 (L) 20.0 - 24.0 mEq/L   TCO2 17 0 - 100 mmol/L   O2 Saturation 94.0 %   Acid-base deficit 10.0 (H) 0.0 - 2.0 mmol/L   Patient temperature 33.4 C    Sample type ARTERIAL   Glucose, capillary     Status: Abnormal   Collection Time: 01/30/15  5:04 AM  Result Value Ref Range   Glucose-Capillary 277 (H) 70 - 99 mg/dL  Carboxyhemoglobin     Status: Abnormal   Collection Time: 01/30/15  5:45 AM  Result Value Ref Range   Total hemoglobin 12.5 (L) 13.5 - 18.0 g/dL   O2 Saturation 81.7 %   Carboxyhemoglobin 0.4 (L) 0.5 - 1.5 %   Methemoglobin 0.8 0.0 - 1.5 %  Glucose, capillary     Status: Abnormal   Collection Time: 01/30/15  6:10 AM  Result Value Ref Range   Glucose-Capillary 240 (H) 70 - 99 mg/dL   Comment 1 Arterial Specimen   Procalcitonin - Baseline     Status: None   Collection Time: 01/30/15  6:15 AM  Result Value Ref Range   Procalcitonin 2.36 ng/mL    Comment:        Interpretation: PCT > 2 ng/mL: Systemic infection (sepsis) is likely, unless other causes are known. (NOTE)         ICU PCT Algorithm               Non ICU PCT Algorithm    ----------------------------     ------------------------------         PCT < 0.25 ng/mL  PCT < 0.1 ng/mL     Stopping of antibiotics            Stopping of antibiotics       strongly encouraged.               strongly encouraged.    ----------------------------     ------------------------------       PCT level decrease by               PCT < 0.25 ng/mL       >= 80% from peak PCT       OR PCT 0.25 - 0.5 ng/mL          Stopping of antibiotics                                             encouraged.     Stopping of antibiotics           encouraged.    ----------------------------      ------------------------------       PCT level decrease by              PCT >= 0.25 ng/mL       < 80% from peak PCT        AND PCT >= 0.5 ng/mL            Continuing antibiotics                                               encouraged.       Continuing antibiotics            encouraged.    ----------------------------     ------------------------------     PCT level increase compared          PCT > 0.5 ng/mL         with peak PCT AND          PCT >= 0.5 ng/mL             Escalation of antibiotics                                          strongly encouraged.      Escalation of antibiotics        strongly encouraged.   Basic metabolic panel     Status: Abnormal   Collection Time: 01/30/15  6:15 AM  Result Value Ref Range   Sodium 141 135 - 145 mmol/L   Potassium 2.8 (L) 3.5 - 5.1 mmol/L   Chloride 108 96 - 112 mmol/L   CO2 20 19 - 32 mmol/L   Glucose, Bld 266 (H) 70 - 99 mg/dL   BUN 28 (H) 6 - 23 mg/dL   Creatinine, Ser 2.06 (H) 0.50 - 1.35 mg/dL   Calcium 7.3 (L) 8.4 - 10.5 mg/dL   GFR calc non Af Amer 27 (L) >90 mL/min   GFR calc Af Amer 31 (L) >90 mL/min    Comment: (NOTE) The eGFR has been calculated using the CKD EPI equation. This calculation has not been validated in all clinical situations. eGFR's persistently <90 mL/min signify  possible Chronic Kidney Disease.    Anion gap 13 5 - 15  Glucose, capillary     Status: Abnormal   Collection Time: 01/30/15  7:15 AM  Result Value Ref Range   Glucose-Capillary 231 (H) 70 - 99 mg/dL  Culture, respiratory (NON-Expectorated)     Status: None (Preliminary result)   Collection Time: 01/30/15  8:15 AM  Result Value Ref Range   Specimen Description TRACHEAL ASPIRATE    Special Requests Immunocompromised    Gram Stain      NO WBC SEEN RARE SQUAMOUS EPITHELIAL CELLS PRESENT FEW GRAM POSITIVE COCCI IN PAIRS AND CHAINS RARE GRAM NEGATIVE RODS RARE GRAM POSITIVE RODS    Culture PENDING    Report Status PENDING   Glucose,  capillary     Status: Abnormal   Collection Time: 01/30/15  8:19 AM  Result Value Ref Range   Glucose-Capillary 213 (H) 70 - 99 mg/dL  Cortisol     Status: None   Collection Time: 01/30/15  8:20 AM  Result Value Ref Range   Cortisol, Plasma 41.2 ug/dL    Comment: (NOTE) AM:  4.3 - 22.4 ug/dL PM:  3.1 - 16.7 ug/dL Performed at [facility]   Troponin I (serum)     Status: Abnormal   Collection Time: 01/30/15  8:29 AM  Result Value Ref Range   Troponin I 31.83 (HH) <0.031 ng/mL    Comment:        POSSIBLE MYOCARDIAL ISCHEMIA. SERIAL TESTING RECOMMENDED. REPEATED TO VERIFY CRITICAL VALUE NOTED.  VALUE IS CONSISTENT WITH PREVIOUSLY REPORTED AND CALLED VALUE.   Basic metabolic panel     Status: Abnormal   Collection Time: 01/30/15  8:34 AM  Result Value Ref Range   Sodium 141 135 - 145 mmol/L   Potassium 3.2 (L) 3.5 - 5.1 mmol/L   Chloride 107 96 - 112 mmol/L   CO2 18 (L) 19 - 32 mmol/L   Glucose, Bld 208 (H) 70 - 99 mg/dL   BUN 26 (H) 6 - 23 mg/dL   Creatinine, Ser 1.93 (H) 0.50 - 1.35 mg/dL   Calcium 7.6 (L) 8.4 - 10.5 mg/dL   GFR calc non Af Amer 29 (L) >90 mL/min   GFR calc Af Amer 34 (L) >90 mL/min    Comment: (NOTE) The eGFR has been calculated using the CKD EPI equation. This calculation has not been validated in all clinical situations. eGFR's persistently <90 mL/min signify possible Chronic Kidney Disease.    Anion gap 16 (H) 5 - 15  Influenza panel by PCR (type A & B, H1N1)     Status: None   Collection Time: 01/30/15  9:07 AM  Result Value Ref Range   Influenza A By PCR NEGATIVE NEGATIVE   Influenza B By PCR NEGATIVE NEGATIVE   H1N1 flu by pcr NOT DETECTED NOT DETECTED    Comment:        The Xpert Flu assay (FDA approved for nasal aspirates or washes and nasopharyngeal swab specimens), is intended as an aid in the diagnosis of influenza and should not be used as a sole basis for treatment.   Glucose, capillary     Status: Abnormal   Collection  Time: 01/30/15  9:23 AM  Result Value Ref Range   Glucose-Capillary 215 (H) 70 - 99 mg/dL  Glucose, capillary     Status: Abnormal   Collection Time: 01/30/15 10:21 AM  Result Value Ref Range   Glucose-Capillary 206 (H) 70 - 99 mg/dL  Comment 1 Arterial Specimen   Troponin I     Status: Abnormal   Collection Time: 01/30/15 10:24 AM  Result Value Ref Range   Troponin I 29.85 (HH) <0.031 ng/mL    Comment:        POSSIBLE MYOCARDIAL ISCHEMIA. SERIAL TESTING RECOMMENDED. REPEATED TO VERIFY CRITICAL VALUE NOTED.  VALUE IS CONSISTENT WITH PREVIOUSLY REPORTED AND CALLED VALUE.   Basic metabolic panel     Status: Abnormal   Collection Time: 01/30/15 10:24 AM  Result Value Ref Range   Sodium 143 135 - 145 mmol/L   Potassium 3.3 (L) 3.5 - 5.1 mmol/L   Chloride 110 96 - 112 mmol/L   CO2 20 19 - 32 mmol/L   Glucose, Bld 212 (H) 70 - 99 mg/dL   BUN 30 (H) 6 - 23 mg/dL   Creatinine, Ser 2.01 (H) 0.50 - 1.35 mg/dL   Calcium 7.2 (L) 8.4 - 10.5 mg/dL   GFR calc non Af Amer 28 (L) >90 mL/min   GFR calc Af Amer 32 (L) >90 mL/min    Comment: (NOTE) The eGFR has been calculated using the CKD EPI equation. This calculation has not been validated in all clinical situations. eGFR's persistently <90 mL/min signify possible Chronic Kidney Disease.    Anion gap 13 5 - 15  Protime-INR now     Status: Abnormal   Collection Time: 01/30/15 10:24 AM  Result Value Ref Range   Prothrombin Time 17.9 (H) 11.6 - 15.2 seconds   INR 1.46 0.00 - 1.49  APTT     Status: Abnormal   Collection Time: 01/30/15 10:24 AM  Result Value Ref Range   aPTT 41 (H) 24 - 37 seconds    Comment:        IF BASELINE aPTT IS ELEVATED, SUGGEST PATIENT RISK ASSESSMENT BE USED TO DETERMINE APPROPRIATE ANTICOAGULANT THERAPY.   Strep pneumoniae urinary antigen     Status: None   Collection Time: 01/30/15 10:36 AM  Result Value Ref Range   Strep Pneumo Urinary Antigen NEGATIVE NEGATIVE    Comment:        Infection due to  S. pneumoniae cannot be absolutely ruled out since the antigen present may be below the detection limit of the test.   Carboxyhemoglobin     Status: Abnormal   Collection Time: 01/30/15 11:00 AM  Result Value Ref Range   Total hemoglobin 11.7 (L) 13.5 - 18.0 g/dL   O2 Saturation 69.8 %   Carboxyhemoglobin 0.8 0.5 - 1.5 %   Methemoglobin 1.0 0.0 - 1.5 %  Glucose, capillary     Status: Abnormal   Collection Time: 01/30/15 11:02 AM  Result Value Ref Range   Glucose-Capillary 205 (H) 70 - 99 mg/dL  Arterial Blood Gas     Status: Abnormal   Collection Time: 01/30/15 11:10 AM  Result Value Ref Range   FIO2 0.40 %   Delivery systems VENTILATOR    Mode PRESSURE REGULATED VOLUME CONTROL    VT 550.0 mL   Rate 20.0 resp/min   Peep/cpap 5.0 cm H20   pH, Arterial 7.369 7.350 - 7.450   pCO2 arterial 27.5 (L) 35.0 - 45.0 mmHg   pO2, Arterial 52.4 (L) 80.0 - 100.0 mmHg   Bicarbonate 16.5 (L) 20.0 - 24.0 mEq/L   TCO2 17.5 0 - 100 mmol/L   Acid-base deficit 8.6 (H) 0.0 - 2.0 mmol/L   O2 Saturation 91.2 %   Patient temperature 90.9    Collection site A-LINE  Drawn by [id number]    Sample type ARTERIAL DRAW   Glucose, capillary     Status: Abnormal   Collection Time: 01/30/15 12:12 PM  Result Value Ref Range   Glucose-Capillary 209 (H) 70 - 99 mg/dL   Comment 1 Arterial Specimen   Glucose, capillary     Status: Abnormal   Collection Time: 01/30/15  1:20 PM  Result Value Ref Range   Glucose-Capillary 185 (H) 70 - 99 mg/dL  Glucose, capillary     Status: Abnormal   Collection Time: 01/30/15  2:28 PM  Result Value Ref Range   Glucose-Capillary 176 (H) 70 - 99 mg/dL   Comment 1 Arterial Specimen   Lactic acid, plasma     Status: Abnormal   Collection Time: 01/30/15  3:00 PM  Result Value Ref Range   Lactic Acid, Venous 8.0 (HH) 0.5 - 2.0 mmol/L    Comment: REPEATED TO VERIFY CRITICAL RESULT CALLED TO, READ BACK BY AND VERIFIED WITH: S RRIVETTE,RN [id number] 01/30/15 WBOND   Glucose,  capillary     Status: Abnormal   Collection Time: 01/30/15  3:37 PM  Result Value Ref Range   Glucose-Capillary 167 (H) 70 - 99 mg/dL  Glucose, capillary     Status: Abnormal   Collection Time: 01/30/15  4:47 PM  Result Value Ref Range   Glucose-Capillary 146 (H) 70 - 99 mg/dL   Comment 1 Arterial Specimen   Troponin I     Status: Abnormal   Collection Time: 01/30/15  5:05 PM  Result Value Ref Range   Troponin I 28.68 (HH) <0.031 ng/mL    Comment:        POSSIBLE MYOCARDIAL ISCHEMIA. SERIAL TESTING RECOMMENDED. REPEATED TO VERIFY CRITICAL VALUE NOTED.  VALUE IS CONSISTENT WITH PREVIOUSLY REPORTED AND CALLED VALUE.   Basic metabolic panel     Status: Abnormal   Collection Time: 01/30/15  5:05 PM  Result Value Ref Range   Sodium 141 135 - 145 mmol/L   Potassium 3.1 (L) 3.5 - 5.1 mmol/L   Chloride 104 96 - 112 mmol/L   CO2 18 (L) 19 - 32 mmol/L   Glucose, Bld 141 (H) 70 - 99 mg/dL   BUN 30 (H) 6 - 23 mg/dL   Creatinine, Ser 2.11 (H) 0.50 - 1.35 mg/dL   Calcium 7.3 (L) 8.4 - 10.5 mg/dL   GFR calc non Af Amer 26 (L) >90 mL/min   GFR calc Af Amer 30 (L) >90 mL/min    Comment: (NOTE) The eGFR has been calculated using the CKD EPI equation. This calculation has not been validated in all clinical situations. eGFR's persistently <90 mL/min signify possible Chronic Kidney Disease.    Anion gap 19 (H) 5 - 15  Glucose, capillary     Status: Abnormal   Collection Time: 01/30/15  5:44 PM  Result Value Ref Range   Glucose-Capillary 115 (H) 70 - 99 mg/dL   Comment 1 Venous Specimen    Comment 2 Arterial Specimen   Glucose, capillary     Status: Abnormal   Collection Time: 01/30/15  6:40 PM  Result Value Ref Range   Glucose-Capillary 121 (H) 70 - 99 mg/dL   Comment 1 Venous Specimen   Glucose, capillary     Status: None   Collection Time: 01/30/15  7:49 PM  Result Value Ref Range   Glucose-Capillary 87 70 - 99 mg/dL   Comment 1 Arterial Specimen   Glucose, capillary     Status:  None  Collection Time: 01/30/15  8:54 PM  Result Value Ref Range   Glucose-Capillary 87 70 - 99 mg/dL   Comment 1 Arterial Specimen   Glucose, capillary     Status: None   Collection Time: 01/30/15  9:57 PM  Result Value Ref Range   Glucose-Capillary 97 70 - 99 mg/dL   Comment 1 Arterial Specimen   Glucose, capillary     Status: None   Collection Time: 01/30/15 10:56 PM  Result Value Ref Range   Glucose-Capillary 92 70 - 99 mg/dL   Comment 1 Arterial Specimen   Troponin I     Status: Abnormal   Collection Time: 01/30/15 11:00 PM  Result Value Ref Range   Troponin I 43.72 (HH) <0.031 ng/mL    Comment:        POSSIBLE MYOCARDIAL ISCHEMIA. SERIAL TESTING RECOMMENDED. CRITICAL VALUE NOTED.  VALUE IS CONSISTENT WITH PREVIOUSLY REPORTED AND CALLED VALUE. REPEATED TO VERIFY   Basic metabolic panel     Status: Abnormal   Collection Time: 01/30/15 11:00 PM  Result Value Ref Range   Sodium 141 135 - 145 mmol/L   Potassium 3.9 3.5 - 5.1 mmol/L    Comment: DELTA CHECK NOTED   Chloride 107 96 - 112 mmol/L   CO2 18 (L) 19 - 32 mmol/L   Glucose, Bld 94 70 - 99 mg/dL   BUN 31 (H) 6 - 23 mg/dL   Creatinine, Ser 2.35 (H) 0.50 - 1.35 mg/dL   Calcium 7.2 (L) 8.4 - 10.5 mg/dL   GFR calc non Af Amer 23 (L) >90 mL/min   GFR calc Af Amer 27 (L) >90 mL/min    Comment: (NOTE) The eGFR has been calculated using the CKD EPI equation. This calculation has not been validated in all clinical situations. eGFR's persistently <90 mL/min signify possible Chronic Kidney Disease.    Anion gap 16 (H) 5 - 15  Glucose, capillary     Status: None   Collection Time: 02/23/15 12:00 AM  Result Value Ref Range   Glucose-Capillary 72 70 - 99 mg/dL   Comment 1 Arterial Specimen   Lactic acid, plasma     Status: Abnormal   Collection Time: 2015-02-23 12:01 AM  Result Value Ref Range   Lactic Acid, Venous 9.7 (HH) 0.5 - 2.0 mmol/L    Comment: CRITICAL RESULT CALLED TO, READ BACK BY AND VERIFIED  WITH: PREVITTE,S RN 2015-02-23 0106 JORDANS REPEATED TO VERIFY   Blood gas, arterial     Status: Abnormal   Collection Time: 23-Feb-2015  3:06 AM  Result Value Ref Range   FIO2 0.40 %   Delivery systems VENTILATOR    Mode PRESSURE REGULATED VOLUME CONTROL    VT 550 mL   Rate 20 resp/min   Peep/cpap 5.0 cm H20   pH, Arterial 7.312 (L) 7.350 - 7.450   pCO2 arterial 34.9 (L) 35.0 - 45.0 mmHg   pO2, Arterial 67.9 (L) 80.0 - 100.0 mmHg   Bicarbonate 17.3 (L) 20.0 - 24.0 mEq/L   TCO2 18.4 0 - 100 mmol/L   Acid-base deficit 7.9 (H) 0.0 - 2.0 mmol/L   O2 Saturation 91.0 %   Patient temperature 97.6    Collection site A-LINE    Drawn by [id number]    Sample type ARTERIAL DRAW    Allens test (pass/fail) PASS PASS  Glucose, capillary     Status: None   Collection Time: 02/23/15  4:04 AM  Result Value Ref Range   Glucose-Capillary 78 70 - 99  mg/dL   Comment 1 Arterial Specimen   Troponin I     Status: Abnormal   Collection Time: 02/08/2015  4:05 AM  Result Value Ref Range   Troponin I 60.44 (HH) <0.031 ng/mL    Comment:        POSSIBLE MYOCARDIAL ISCHEMIA. SERIAL TESTING RECOMMENDED. CRITICAL VALUE NOTED.  VALUE IS CONSISTENT WITH PREVIOUSLY REPORTED AND CALLED VALUE. REPEATED TO VERIFY   Procalcitonin     Status: None   Collection Time: 02-08-15  4:05 AM  Result Value Ref Range   Procalcitonin 2.45 ng/mL    Comment:        Interpretation: PCT > 2 ng/mL: Systemic infection (sepsis) is likely, unless other causes are known. (NOTE)         ICU PCT Algorithm               Non ICU PCT Algorithm    ----------------------------     ------------------------------         PCT < 0.25 ng/mL                 PCT < 0.1 ng/mL     Stopping of antibiotics            Stopping of antibiotics       strongly encouraged.               strongly encouraged.    ----------------------------     ------------------------------       PCT level decrease by               PCT < 0.25 ng/mL       >= 80% from  peak PCT       OR PCT 0.25 - 0.5 ng/mL          Stopping of antibiotics                                             encouraged.     Stopping of antibiotics           encouraged.    ----------------------------     ------------------------------       PCT level decrease by              PCT >= 0.25 ng/mL       < 80% from peak PCT        AND PCT >= 0.5 ng/mL            Continuing antibiotics                                               encouraged.       Continuing antibiotics            encouraged.    ----------------------------     ------------------------------     PCT level increase compared          PCT > 0.5 ng/mL         with peak PCT AND          PCT >= 0.5 ng/mL             Escalation of antibiotics  strongly encouraged.      Escalation of antibiotics        strongly encouraged.   Magnesium     Status: None   Collection Time: February 20, 2015  4:05 AM  Result Value Ref Range   Magnesium 2.1 1.5 - 2.5 mg/dL  Phosphorus     Status: Abnormal   Collection Time: 2015/02/20  4:05 AM  Result Value Ref Range   Phosphorus 6.3 (H) 2.3 - 4.6 mg/dL  CBC with Differential/Platelet     Status: Abnormal   Collection Time: 2015/02/20  4:05 AM  Result Value Ref Range   WBC 10.5 4.0 - 10.5 K/uL   RBC 4.25 4.22 - 5.81 MIL/uL   Hemoglobin 11.1 (L) 13.0 - 17.0 g/dL   HCT 34.6 (L) 39.0 - 52.0 %   MCV 81.4 78.0 - 100.0 fL   MCH 26.1 26.0 - 34.0 pg   MCHC 32.1 30.0 - 36.0 g/dL   RDW 15.4 11.5 - 15.5 %   Platelets 126 (L) 150 - 400 K/uL    Comment: DELTA CHECK NOTED SPECIMEN CHECKED FOR CLOTS REPEATED TO VERIFY    Neutrophils Relative % 82 (H) 43 - 77 %   Neutro Abs 8.5 (H) 1.7 - 7.7 K/uL   Lymphocytes Relative 9 (L) 12 - 46 %   Lymphs Abs 0.9 0.7 - 4.0 K/uL   Monocytes Relative 9 3 - 12 %   Monocytes Absolute 1.0 0.1 - 1.0 K/uL   Eosinophils Relative 0 0 - 5 %   Eosinophils Absolute 0.0 0.0 - 0.7 K/uL   Basophils Relative 0 0 - 1 %   Basophils Absolute  0.0 0.0 - 0.1 K/uL  Basic metabolic panel     Status: Abnormal   Collection Time: 20-Feb-2015  4:05 AM  Result Value Ref Range   Sodium 139 135 - 145 mmol/L   Potassium 5.1 3.5 - 5.1 mmol/L    Comment: DELTA CHECK NOTED   Chloride 103 96 - 112 mmol/L   CO2 17 (L) 19 - 32 mmol/L   Glucose, Bld 80 70 - 99 mg/dL   BUN 35 (H) 6 - 23 mg/dL   Creatinine, Ser 2.65 (H) 0.50 - 1.35 mg/dL   Calcium 6.7 (L) 8.4 - 10.5 mg/dL   GFR calc non Af Amer 20 (L) >90 mL/min   GFR calc Af Amer 23 (L) >90 mL/min    Comment: (NOTE) The eGFR has been calculated using the CKD EPI equation. This calculation has not been validated in all clinical situations. eGFR's persistently <90 mL/min signify possible Chronic Kidney Disease.    Anion gap 19 (H) 5 - 15  Protime-INR     Status: Abnormal   Collection Time: 02-20-15  4:05 AM  Result Value Ref Range   Prothrombin Time 22.9 (H) 11.6 - 15.2 seconds   INR 2.01 (H) 0.00 - 1.49  Glucose, capillary     Status: Abnormal   Collection Time: 02-20-2015  7:52 AM  Result Value Ref Range   Glucose-Capillary 56 (L) 70 - 99 mg/dL   Comment 1 Arterial Specimen     Imaging: Dg Chest Port 1 View  February 20, 2015   CLINICAL DATA:  Hypoxia  EXAM: PORTABLE CHEST - 1 VIEW  COMPARISON:  January 29, 2015  FINDINGS: Endotracheal tube tip is 1.3 cm above the carina. Nasogastric tube tip and side port are below the diaphragm. Central catheter tip is in the left innominate vein near the junction with the superior vena cava. No pneumothorax. There  is patchy bibasilar atelectasis. Lungs are otherwise clear. Heart is mildly enlarged with pulmonary vascularity within normal limits. No adenopathy. No bone lesions.  IMPRESSION: Tube and catheter positions as described without pneumothorax. Patchy bibasilar atelectasis. Stable cardiac prominence.   Electronically Signed   By: Lowella Grip III M.D.   On: 02-18-2015 07:20   Dg Chest Port 1 View  01/30/2015   CLINICAL DATA:  Central line placement.   Initial encounter.  EXAM: PORTABLE CHEST - 1 VIEW  COMPARISON:  Chest radiograph performed earlier today at 9:39 p.m.  FINDINGS: The patient's left IJ line is noted ending overlying the proximal to mid SVC.  The endotracheal tube is seen ending 2-3 cm above the carina. An enteric tube is noted extending below the diaphragm.  Increased interstitial markings are seen, more prominent on the right. This is mildly improved from the prior study, and may reflect pneumonia or asymmetric interstitial edema. No pleural effusion or pneumothorax is seen.  The cardiomediastinal silhouette is mildly enlarged. No acute osseous abnormalities are identified.  IMPRESSION: 1. Left IJ line noted ending overlying the proximal to mid SVC. 2. Endotracheal tube seen ending 2-3 cm above the carina. 3. Mild interval improvement in increased interstitial markings, more prominent on the right. This may reflect pneumonia or asymmetric interstitial edema. 4. Mild cardiomegaly noted.   Electronically Signed   By: Garald Balding M.D.   On: 01/30/2015 00:28   Dg Chest Port 1 View  01/23/2015   CLINICAL DATA:  Central line placement  EXAM: PORTABLE CHEST - 1 VIEW  COMPARISON:  06/04/2008  FINDINGS: Endotracheal tube terminates 3 cm above the carina.  Left IJ venous catheter terminates in the mid SVC.  Patchy right upper lobe opacity. In the appropriate clinical setting, this is suspicious for pneumonia.  Possible superimposed mild interstitial edema. No pleural effusion or pneumothorax.  Cardiomegaly.  IMPRESSION: Endotracheal tube terminates 3 cm above the carina.  Left IJ venous catheter terminates in the mid SVC.  No pneumothorax.  Patchy right upper lobe opacity. In the appropriate clinical setting, suspicious for pneumonia.  Cardiomegaly.  Possible superimposed mild interstitial edema.   Electronically Signed   By: Julian Hy M.D.   On: 01/26/2015 21:59   Dg Abd Portable 1v  02/05/2015   CLINICAL DATA:  NG tube insertion  EXAM:  PORTABLE ABDOMEN - 1 VIEW  COMPARISON:  None.  FINDINGS: Enteric tube is looped in the stomach and terminates in the gastric antrum.  Operative bowel gas pattern.  Moderate right colonic stool burden.  IMPRESSION: Enteric tube is looped in the stomach and terminates in the gastric antrum.   Electronically Signed   By: Julian Hy M.D.   On: 01/23/2015 20:58    Assessment:  Principal Problem:   ST elevation myocardial infarction (STEMI) of anterior wall, initial episode of care Active Problems:   Cardiac arrest - in setting of Anterior STEMI   CAD S/P PCI to Prox LAD Promus Premier DES 3.5 mm x 20 mm (~3.9 mm)   Cardiogenic shock   Acute respiratory failure with hypoxia   Shock circulatory   Acute kidney injury   Plan:  STEMI - critically ill with multiple pressors at this point, on arctic sun protocol. Now almost rewarmed. MAP is up to 90, therefore we can come down somewhat on his pressor requirement. I turned down his pacer backup rate to 50 today and he seems to be conduction with a junctional bradycardia that is stable in the low  50;s. EEG suggests a poor brain prognosis.  There is progressive renal dysfunction and lactic acidosis - troponin continues to rise. Procalcitonin is not elevated - suspect this is worsening cardiogenic shock.  Discussed with the family about about possible transition to comfort approach.  They still wish to continue aggressive treatment at this time. They don't feel he would want dialysis or CVVH - I would agree with that.  Family updated regarding prognosis.  CRITICAL CARE:  The patient is critically ill with multi-organ system failure and requires high complexity decision making for assessment and support, frequent evaluation and titration of therapies, application of advanced monitoring technologies and extensive interpretation of multiple databases.  Time Spent Directly with Patient:  45 minutes  Length of Stay:  LOS: 2 days   Pixie Casino,  MD, [facility] Attending Cardiologist [facility]  HILTY,Kenneth C 02/22/2015, 8:56 AM

## 2015-02-08 NOTE — Progress Notes (Signed)
Hypoglycemic Event  CBG: 56  Treatment: D50 IV 25 mL  Symptoms: None  Follow-up CBG: Time:0800 CBG Result: 70  Possible Reasons for Event: Unknown    Jeremy Mcdaniel, Jeremy Mcdaniel  Remember to initiate Hypoglycemia Order Set & complete

## 2015-02-08 NOTE — Progress Notes (Signed)
NUTRITION FOLLOW-UP INTERVENTION: If family desires aggressive care recommend:   Initiate Vital AF 1.2 @ 20 ml/hr via OG tube and increase by 10 ml every 4 hours to goal rate of 60 ml/hr.   Tube feeding regimen provides 1728 kcal (98% of needs), 108 grams of protein, and 1167 ml of H2O.    NUTRITION DIAGNOSIS: Inadequate oral intake related to inability to eat as evidenced by NPO status; ongoing.   Goal: Pt to meet >/= 90% of their estimated nutrition needs; not met.    Monitor:  Vent status, Temperature, TF initiation, labs   ASSESSMENT: Pt with hx of HTN, CHF, DM admitted from home with witnessed cardiac arrest. Pt intubated taken to cath lab where he had 100% LAD occlusion requiring PCI.   Patient is currently intubated on ventilator support MV: 11.3 L/min Temp (24hrs), Avg:95.7 F (35.4 C), Min:91.9 F (33.3 C), Max:98.4 F (36.9 C)  Discussed nutrition goals with MD.  MD wishes to not start nutrition at this time and to address goals with family.  Phosphorus elevated  Height: Ht Readings from Last 1 Encounters:  02/03/2015 5' 8" (1.727 m)    Weight: Wt Readings from Last 1 Encounters:  01/22/2015 203 lb 11.3 oz (92.4 kg)  Usual weight: 190 lb (86 kg) with BMI of 28.8  BMI:  Body mass index is 30.98 kg/(m^2).  Estimated Nutritional Needs: Kcal: 1762 Protein: 108-120 Fluid: > 1.7 L/day  Skin: intact  Diet Order:     Intake/Output Summary (Last 24 hours) at 01/10/2015 1324 Last data filed at 01/19/2015 1229  Gross per 24 hour  Intake 4541.28 ml  Output    415 ml  Net 4126.28 ml    Last BM: 3/22 - loose/watery   Labs:   Recent Labs Lab 01/30/15 0008  01/30/15 1705 01/30/15 2300 01/24/2015 0405  NA  --   < > 141 141 139  K  --   < > 3.1* 3.9 5.1  CL  --   < > 104 107 103  CO2  --   < > 18* 18* 17*  BUN  --   < > 30* 31* 35*  CREATININE  --   < > 2.11* 2.35* 2.65*  CALCIUM  --   < > 7.3* 7.2* 6.7*  MG 1.9  --   --   --  2.1  PHOS  --   --   --    --  6.3*  GLUCOSE  --   < > 141* 94 80  < > = values in this interval not displayed.  CBG (last 3)   Recent Labs  01/16/2015 0404 01/13/2015 0752 01/15/2015 0844  GLUCAP 78 56* 70    Scheduled Meds: . antiseptic oral rinse  15 mL Mouth Rinse QID  . aspirin  81 mg Per Tube Daily  . chlorhexidine  15 mL Mouth/Throat BID  . Chlorhexidine Gluconate Cloth  6 each Topical Q0600  . imipenem-cilastatin  250 mg Intravenous Q12H  . insulin aspart  2-6 Units Subcutaneous 6 times per day  . insulin glargine  30 Units Subcutaneous Q24H  . mupirocin ointment  1 application Nasal BID  . pantoprazole sodium  40 mg Per Tube Daily  . sodium chloride  10-40 mL Intracatheter Q12H  . sodium chloride  3 mL Intravenous Q12H  . ticagrelor  90 mg Oral BID  . [START ON 02/01/2015] vancomycin  1,000 mg Intravenous Q48H    Continuous Infusions: . amiodarone 30 mg/hr (01/08/2015 0825)  .   fentaNYL infusion INTRAVENOUS 150 mcg/hr (01/09/2015 0855)  . midazolam (VERSED) infusion 6 mg/hr (02/05/2015 0800)  . norepinephrine (LEVOPHED) Adult infusion 50 mcg/min (01/09/2015 0800)  .  sodium bicarbonate 150 mEq in sterile water 1000 mL infusion 75 mL/hr at 01/18/2015 0825  . vasopressin (PITRESSIN) infusion - *FOR SHOCK* 0.03 Units/min (01/08/2015 0700)   Heather Pitts RD, LDN, CNSC [phone] Pager [phone] After Hours Pager   

## 2015-02-08 DEATH — deceased

## 2015-03-02 NOTE — Discharge Summary (Signed)
DISCHARGE SUMMARY    Date of admit: 01/30/2015  4:56 PM Date of discharge: 02/01/2015  3:38 AM Length of Stay: 3 days  PCP is MOREIRA,ROY, MD   PROBLEM LIST Principal Problem:   ST elevation myocardial infarction (STEMI) of anterior wall, initial episode of care Active Problems:   Cardiac arrest - in setting of Anterior STEMI   CAD S/P PCI to Prox LAD Promus Premier DES 3.5 mm x 20 mm (~3.9 mm)   Cardiogenic shock   Acute respiratory failure with hypoxia   Shock circulatory   Acute kidney failure   DNAR (do not attempt resuscitation)     SUMMARY MILOS MILLIGAN was 81 y.o. patient with    has a past medical history of Hypertension; Hypercholesterolemia; Hernia; Hypercholesterolemia; Leg cramps; CLL (chronic lymphocytic leukemia); Diastolic dysfunction (17/79/3903); TIA (transient ischemic attack) (10/21/2014); and Diabetes mellitus type II, controlled, with no complications.   has past surgical history that includes Hemorrhoid surgery; Hernia repair; Cataract extraction w/PHACO (05/16/2012); Cataract extraction w/PHACO (07/14/2012); and left heart catheterization with coronary angiogram (N/A, 01/14/2015).   Admitted on 01/11/2015 with    81 year old male with PMH as below, which is significant for HTN, HLD, CLL, DM and CHF. He was outside at his house gardening when he suffered a witnessed cardiac arrest. Wife called EMS, 4-5 mins without CPR until their arrival. He was found to be in VF arrest. Underwent 20 mins ACLS epi x 4 and defibrillation. Total downtime estimated 25 mins. In ED he was found to have inferior ST elevation on EKG and was taken to cath lab. He had 100% LAD occlusion requiring PCI. During this he converted to VF twice, converting back to sinus with a single defibrillation each time. Post-procedurally he was taken to ICU on ventilator. PCCM primary.    EVENTS 3/22 Cardiac arrest > to cath lab 100% LAD occlusion > PCI > to ICU on vent  01/30/15: On inducted  hypothermia protocol. Worsening lactic acidosis and multiple pressors. - vasopressin, levophed, neo and bicarb gtt added overnight. This AM off neo. Leviphed at 9mcg. On amio gtt. Echo ordered. SCVO2 88%. EEG - EEG because of the presence of very prolonged periods of background suppression with intermixed bursts of low amplitude theta-delta activity. Tracheal aspirate sent  02/26/15: EEG with burst suppression. Rewarming ongoing +. On fent/versed gtt. On vasopressors - vaso, levophed 50 but off neo. On amio gtt as well. Near anuric/creat up. Trop 40 with worsening lactic acidosis 9. Flu nasal swab negative. Also developed acute renal failure. Displayed myoclonus as well  BAsed on all of above. Family meeting held 02/26/15 and patient made dNAR. Later in day they approached medical team and requested terminal wean and patient expired 02/01/2015     SIGNED Dr. Brand Males, M.D., [facility].C.P Pulmonary and Critical Care Medicine Staff Physician [facility] Pager: [phone], If no answer or between  15:00h - 7:00h: call [phone]  03/02/2015 5:32 PM

## 2015-05-21 ENCOUNTER — Other Ambulatory Visit: Payer: Medicare Other

## 2015-05-21 ENCOUNTER — Ambulatory Visit: Payer: Medicare Other | Admitting: Internal Medicine

## 2016-01-05 IMAGING — MR MR MRA HEAD W/O CM
1 series · 16 of 48 positions shown · non-contrast
Comparison: Head CT 10/20/2014.  MRI 06/04/2008.

CLINICAL DATA: New onset of memory loss and dizziness, 2 days
duration.

EXAM:
MRI HEAD WITHOUT CONTRAST
MRA HEAD WITHOUT CONTRAST
TECHNIQUE: Multiplanar, multiecho pulse sequences of the brain and surrounding
structures were obtained without intravenous contrast. Angiographic
images of the head were obtained using MRA technique without
contrast.

[Series 1: MRA · axial · 0.8mm · 0.38mm/px · z∈[-106,-8]mm · 16 of 131 slices shown]
[im 1/131]
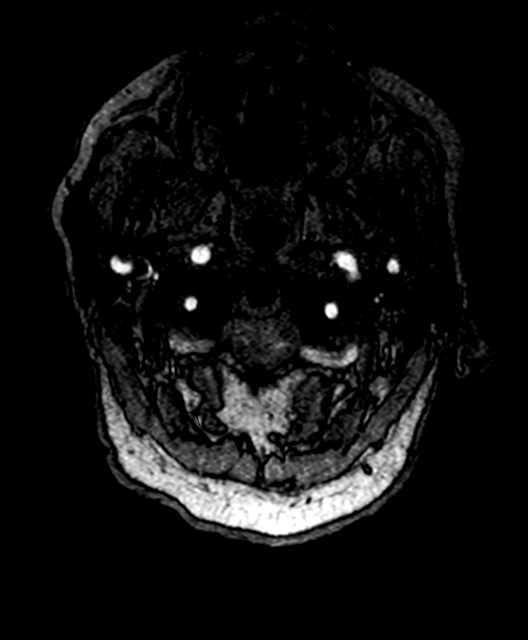
[im 3/131]
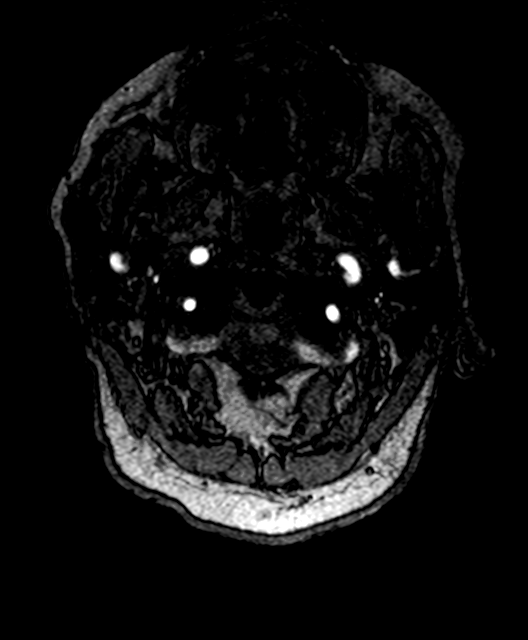
[im 6/131]
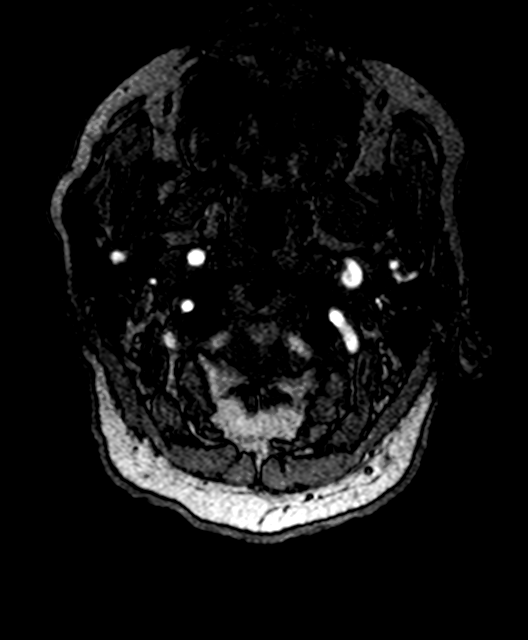
[im 9/131]
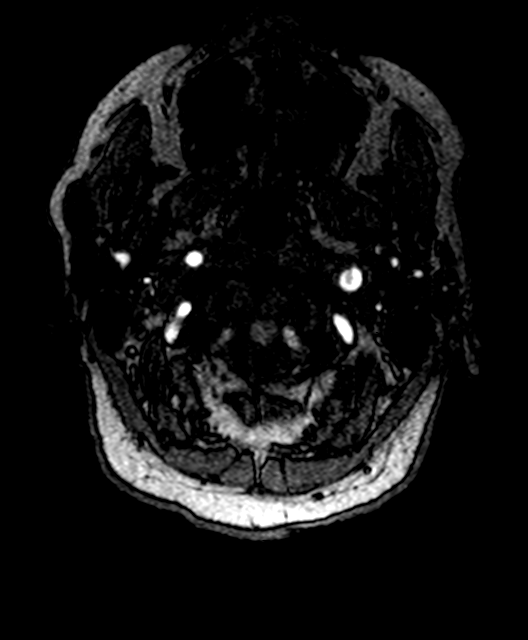
[im 12/131]
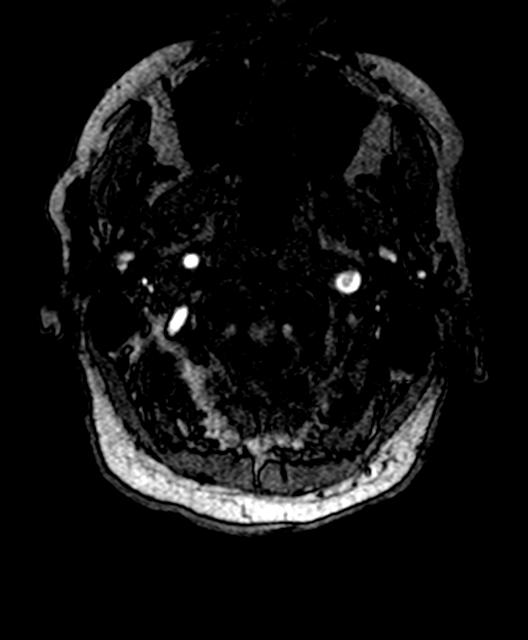
[im 14/131]
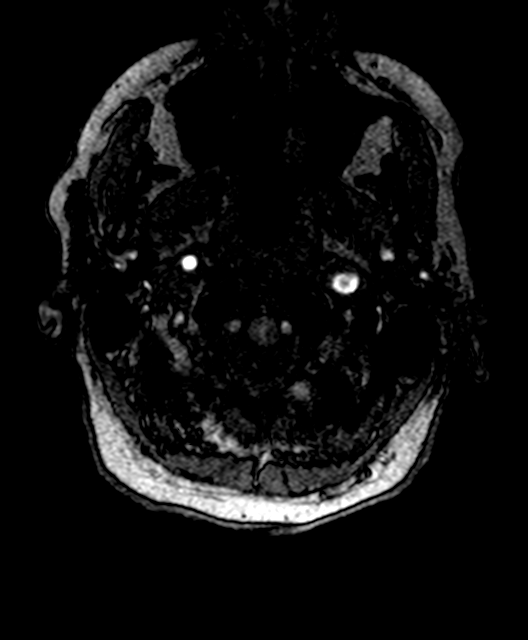
[im 23/131]
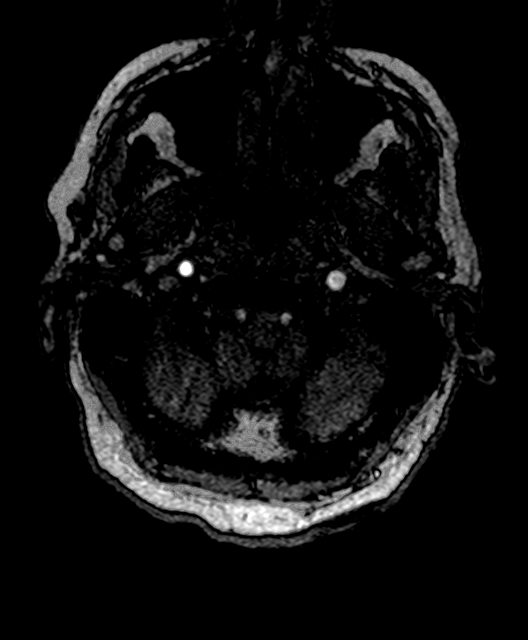
[im 25/131]
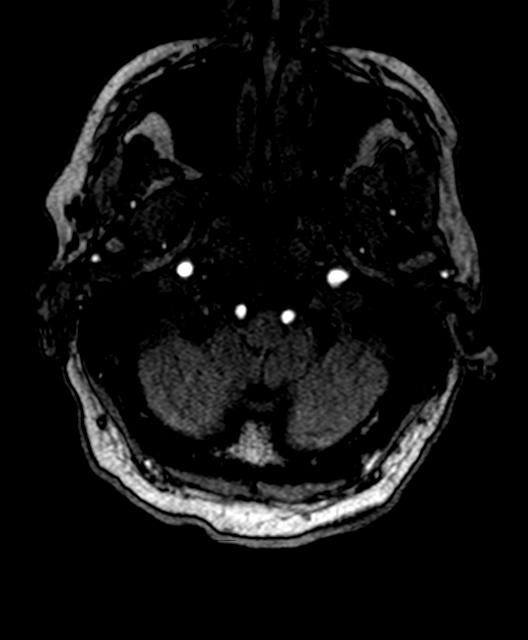
[im 42/131]
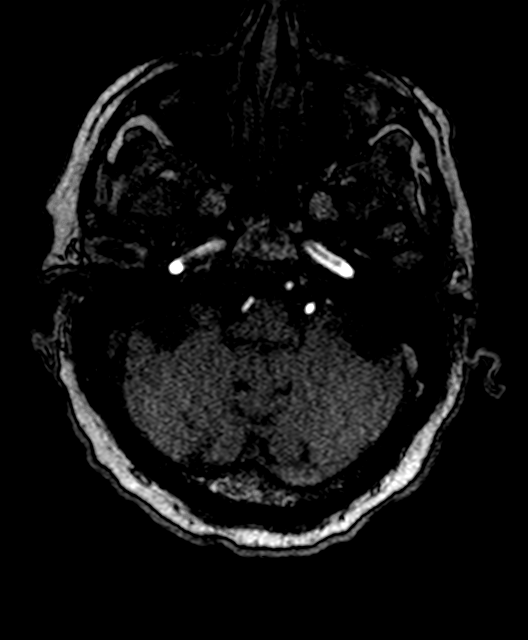
[im 59/131]
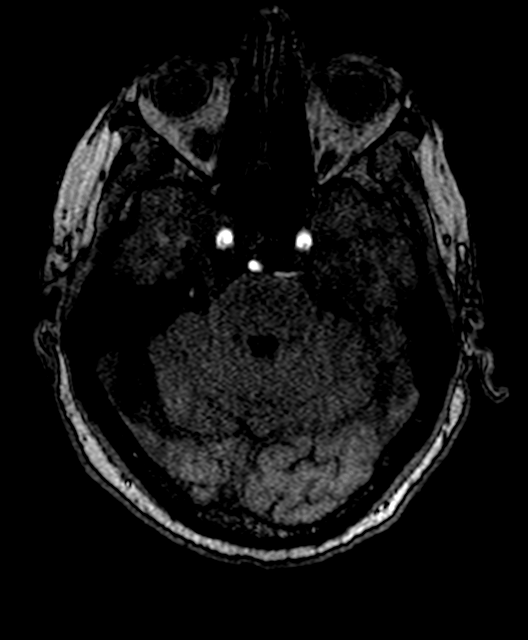
[im 67/131]
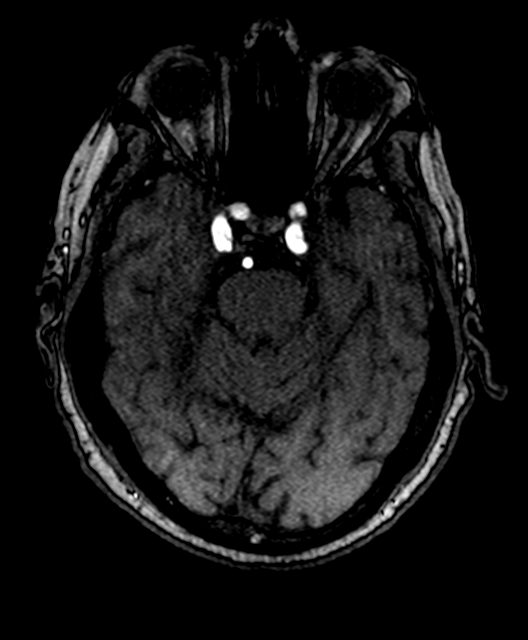
[im 75/131]
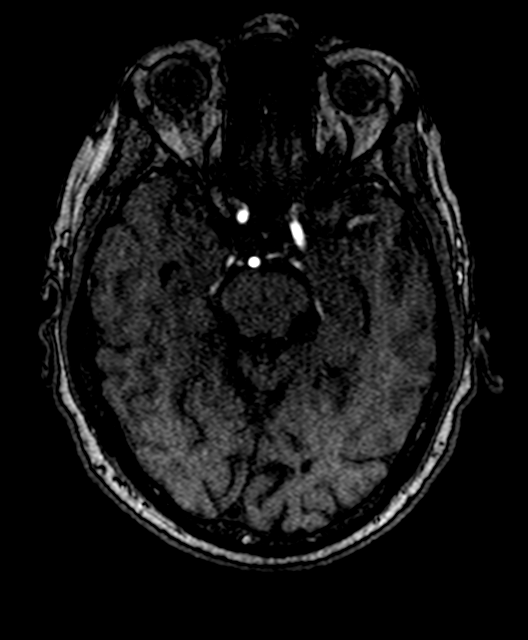
[im 92/131]
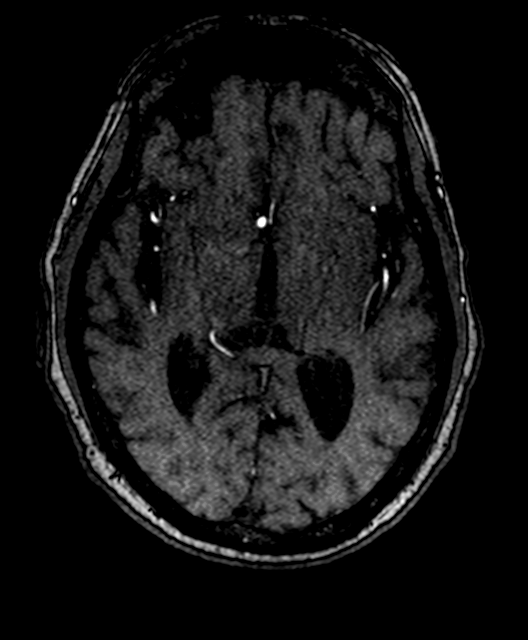
[im 108/131]
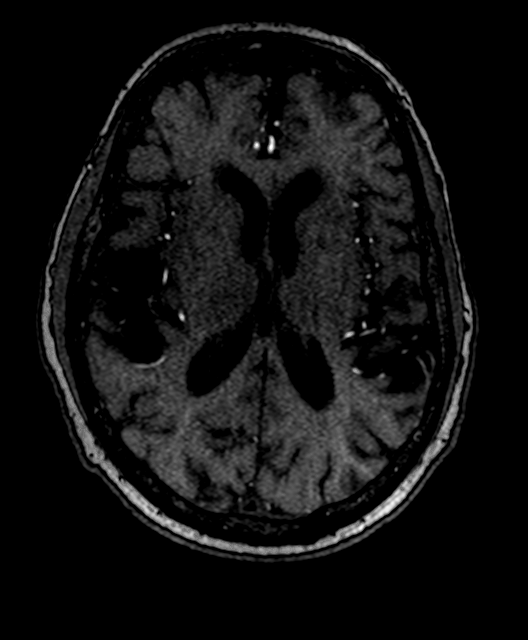
[im 111/131]
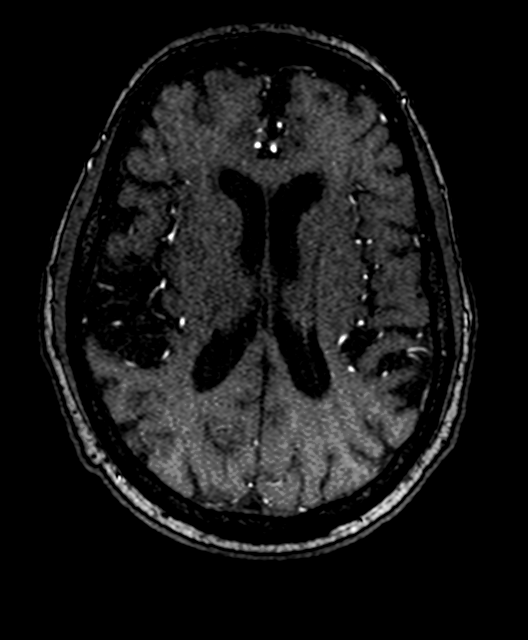
[im 125/131]
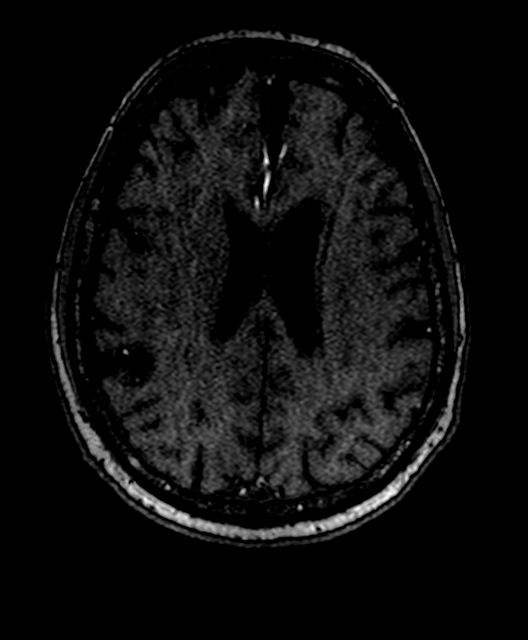

[16 of 48 positions shown; findings below may reference images not displayed]

FINDINGS: MRI HEAD FINDINGS

Diffusion imaging does not show any acute or subacute infarction.
There are mild chronic small-vessel ischemic changes of the pons. No
focal cerebellar insult. Within the cerebral hemispheres, there are
chronic small-vessel ischemic changes affecting the deep and
subcortical white matter. No cortical or large vessel territory
infarction. No mass lesion, hemorrhage, hydrocephalus or extra-axial
collection. No pituitary mass. No inflammatory sinus disease. No
skull or skullbase lesion.

MRA HEAD FINDINGS

Both internal carotid arteries are widely patent into the brain. The
anterior and middle cerebral vessels are patent without proximal
stenosis, aneurysm or vascular malformation. There is signal loss
relating to tortuosity, not pathologic. Both vertebral arteries are
widely patent to the basilar. No basilar stenosis. Posterior
circulation branch vessels are patent.
IMPRESSION: No acute or reversible finding.

Ordinary chronic small-vessel ischemic changes, less than often seen
in healthy individuals of this age.

Intracranial MR angiography shows tortuosity of the vessels but no
stenosis or occlusion.

## 2016-01-24 ENCOUNTER — Other Ambulatory Visit: Payer: Self-pay | Admitting: Nurse Practitioner

## 2016-04-14 IMAGING — CR DG ABD PORTABLE 1V
1 series · 1 of 1 positions shown · non-contrast
Comparison: None.

CLINICAL DATA: NG tube insertion

EXAM:
PORTABLE ABDOMEN - 1 VIEW

[AP]
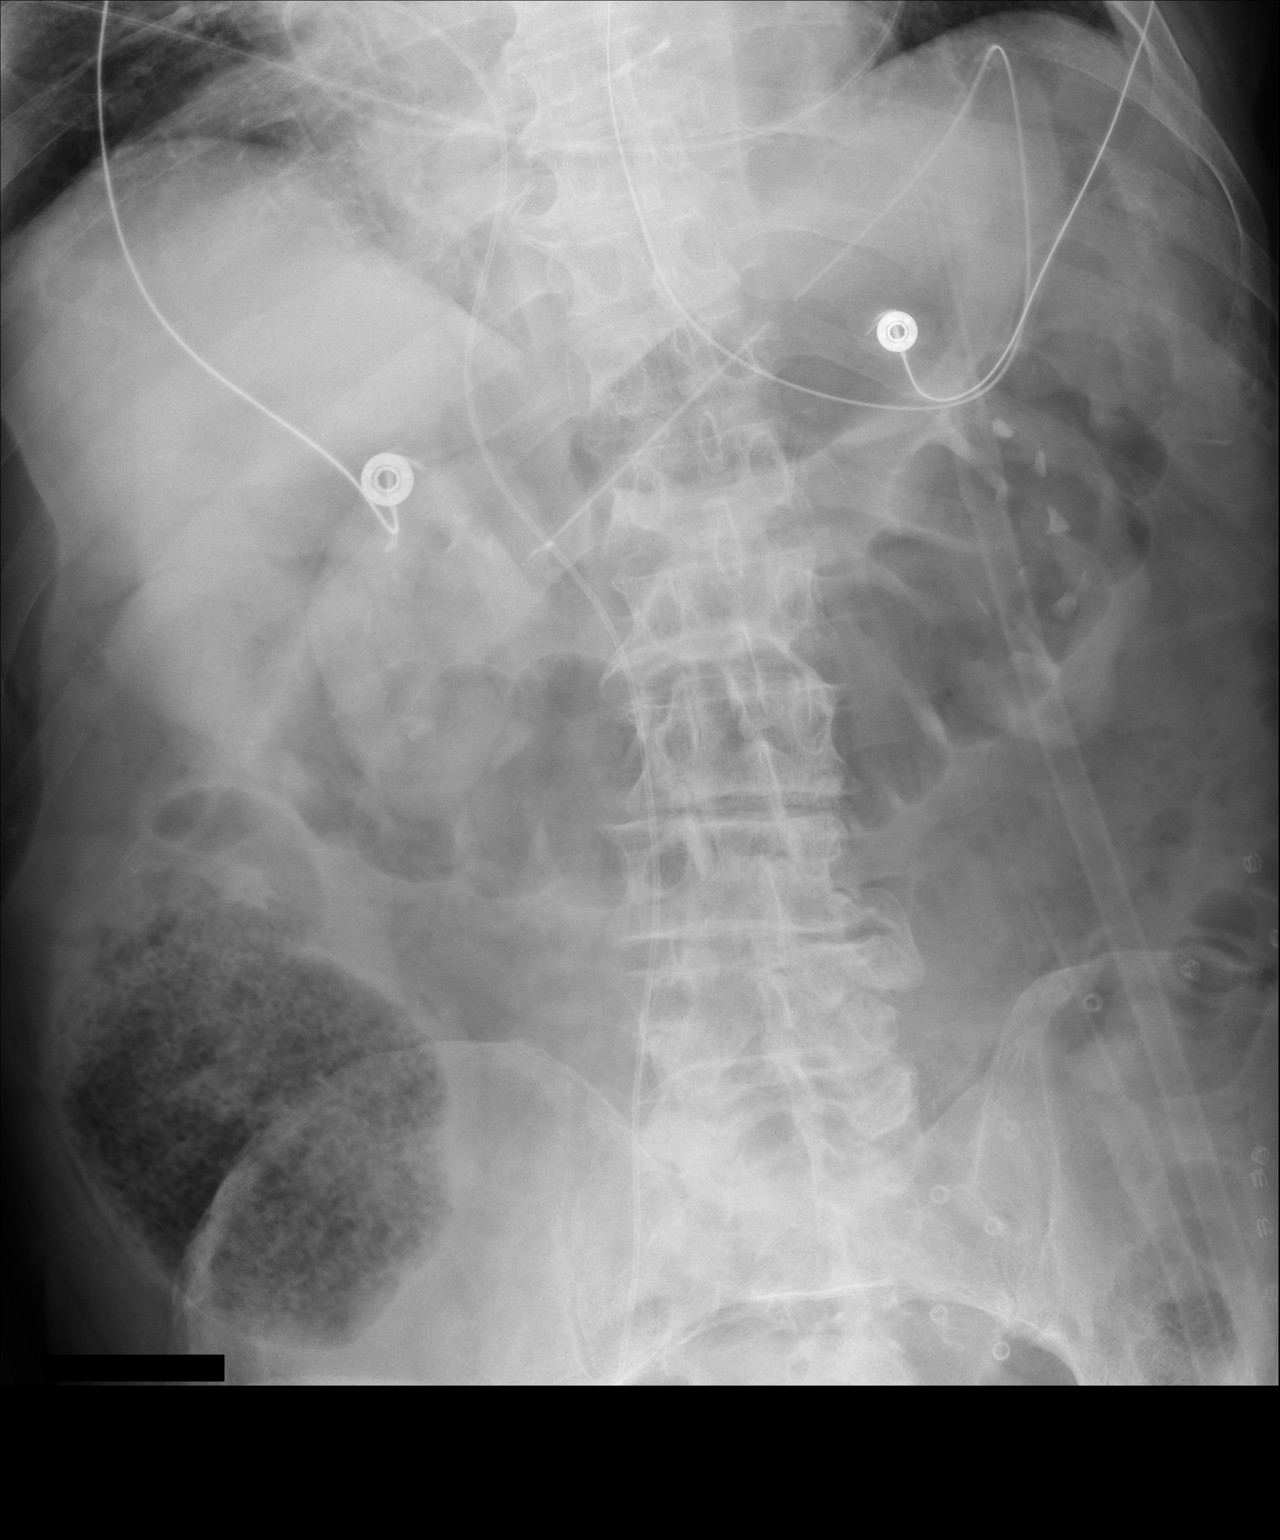

[1 of 1 positions shown; findings below may reference images not displayed]

FINDINGS: Enteric tube is looped in the stomach and terminates in the gastric
antrum.

Operative bowel gas pattern.

Moderate right colonic stool burden.
IMPRESSION: Enteric tube is looped in the stomach and terminates in the gastric
antrum.

## 2016-04-16 IMAGING — CR DG CHEST 1V PORT
1 series · 1 of 1 positions shown · non-contrast
Comparison: January 29, 2015

CLINICAL DATA: Hypoxia

EXAM:
PORTABLE CHEST - 1 VIEW

[AP]
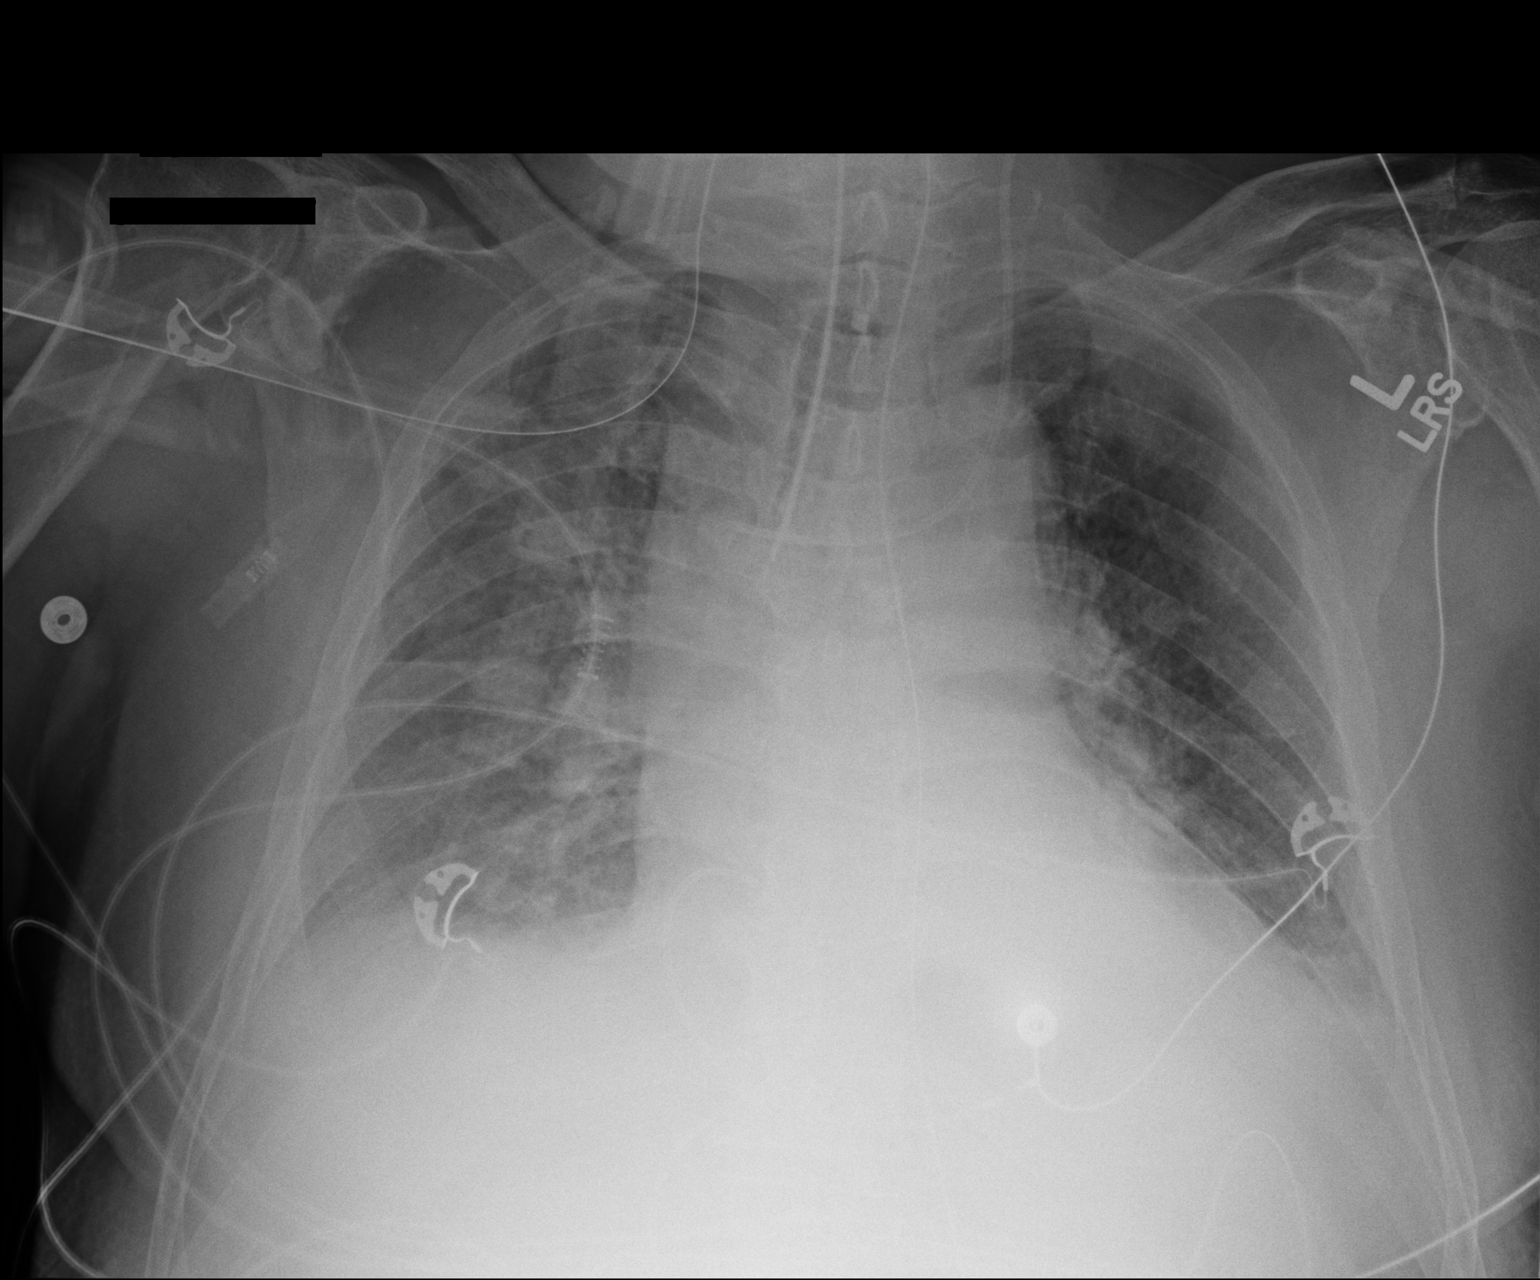

[1 of 1 positions shown; findings below may reference images not displayed]

FINDINGS: Endotracheal tube tip is 1.3 cm above the carina. Nasogastric tube
tip and side port are below the diaphragm. Central catheter tip is
in the left innominate vein near the junction with the superior vena
cava. No pneumothorax. There is patchy bibasilar atelectasis. Lungs
are otherwise clear. Heart is mildly enlarged with pulmonary
vascularity within normal limits. No adenopathy. No bone lesions.
IMPRESSION: Tube and catheter positions as described without pneumothorax.
Patchy bibasilar atelectasis. Stable cardiac prominence.
